# Patient Record
Sex: Female | Born: 1975
Health system: Southern US, Community
[De-identification: ages and names within clinical notes are randomized; demographics above are authoritative.]

## PROBLEM LIST (undated history)

## (undated) DIAGNOSIS — D649 Anemia, unspecified: Secondary | ICD-10-CM

## (undated) DIAGNOSIS — K219 Gastro-esophageal reflux disease without esophagitis: Secondary | ICD-10-CM

## (undated) DIAGNOSIS — D219 Benign neoplasm of connective and other soft tissue, unspecified: Secondary | ICD-10-CM

## (undated) HISTORY — DX: Benign neoplasm of connective and other soft tissue, unspecified: D21.9

---

## 1997-01-19 HISTORY — PX: TUBAL LIGATION: SHX77

## 1997-01-19 HISTORY — PX: ECTOPIC PREGNANCY SURGERY: SHX613

## 1997-07-26 ENCOUNTER — Inpatient Hospital Stay (HOSPITAL_COMMUNITY): Admission: EM | Admit: 1997-07-26 | Discharge: 1997-07-28 | Payer: Self-pay | Admitting: Emergency Medicine

## 1998-05-30 ENCOUNTER — Encounter: Payer: Self-pay | Admitting: Emergency Medicine

## 1998-05-30 ENCOUNTER — Emergency Department (HOSPITAL_COMMUNITY): Admission: EM | Admit: 1998-05-30 | Discharge: 1998-05-30 | Payer: Self-pay | Admitting: Emergency Medicine

## 1998-12-23 ENCOUNTER — Emergency Department (HOSPITAL_COMMUNITY): Admission: EM | Admit: 1998-12-23 | Discharge: 1998-12-23 | Payer: Self-pay | Admitting: Emergency Medicine

## 1999-05-22 ENCOUNTER — Emergency Department (HOSPITAL_COMMUNITY): Admission: EM | Admit: 1999-05-22 | Discharge: 1999-05-22 | Payer: Self-pay | Admitting: Internal Medicine

## 2000-03-23 ENCOUNTER — Ambulatory Visit (HOSPITAL_COMMUNITY): Admission: RE | Admit: 2000-03-23 | Discharge: 2000-03-23 | Payer: Self-pay | Admitting: Obstetrics and Gynecology

## 2000-03-23 ENCOUNTER — Encounter: Payer: Self-pay | Admitting: Obstetrics and Gynecology

## 2002-03-22 ENCOUNTER — Emergency Department (HOSPITAL_COMMUNITY): Admission: EM | Admit: 2002-03-22 | Discharge: 2002-03-22 | Payer: Self-pay | Admitting: Emergency Medicine

## 2002-05-17 ENCOUNTER — Emergency Department (HOSPITAL_COMMUNITY): Admission: EM | Admit: 2002-05-17 | Discharge: 2002-05-17 | Payer: Self-pay | Admitting: Emergency Medicine

## 2002-05-17 ENCOUNTER — Encounter: Payer: Self-pay | Admitting: Emergency Medicine

## 2004-06-16 ENCOUNTER — Emergency Department (HOSPITAL_COMMUNITY): Admission: EM | Admit: 2004-06-16 | Discharge: 2004-06-16 | Payer: Self-pay | Admitting: Emergency Medicine

## 2005-12-22 ENCOUNTER — Emergency Department (HOSPITAL_COMMUNITY): Admission: EM | Admit: 2005-12-22 | Discharge: 2005-12-22 | Payer: Self-pay | Admitting: Emergency Medicine

## 2008-01-02 ENCOUNTER — Emergency Department (HOSPITAL_COMMUNITY): Admission: EM | Admit: 2008-01-02 | Discharge: 2008-01-02 | Payer: Self-pay | Admitting: Emergency Medicine

## 2010-10-23 LAB — RAPID STREP SCREEN (MED CTR MEBANE ONLY): Streptococcus, Group A Screen (Direct): NEGATIVE

## 2014-08-14 ENCOUNTER — Other Ambulatory Visit
Admission: RE | Admit: 2014-08-14 | Discharge: 2014-08-14 | Disposition: A | Discharge status: Home / Self Care | Payer: PRIVATE HEALTH INSURANCE | Source: Ambulatory Visit | Attending: Occupational Medicine | Admitting: Occupational Medicine

## 2015-08-12 ENCOUNTER — Emergency Department (HOSPITAL_COMMUNITY)
Admission: EM | Admit: 2015-08-12 | Discharge: 2015-08-12 | Disposition: A | Discharge status: Home / Self Care | Payer: Self-pay

## 2015-08-12 ENCOUNTER — Emergency Department (HOSPITAL_BASED_OUTPATIENT_CLINIC_OR_DEPARTMENT_OTHER): Payer: 59

## 2015-08-12 ENCOUNTER — Emergency Department (HOSPITAL_BASED_OUTPATIENT_CLINIC_OR_DEPARTMENT_OTHER)
Admission: EM | Admit: 2015-08-12 | Discharge: 2015-08-12 | Disposition: A | Discharge status: Home / Self Care | Payer: 59 | Attending: Emergency Medicine | Admitting: Emergency Medicine

## 2015-08-12 ENCOUNTER — Encounter (HOSPITAL_BASED_OUTPATIENT_CLINIC_OR_DEPARTMENT_OTHER): Payer: Self-pay

## 2015-08-12 DIAGNOSIS — R1013 Epigastric pain: Secondary | ICD-10-CM | POA: Insufficient documentation

## 2015-08-12 DIAGNOSIS — R079 Chest pain, unspecified: Secondary | ICD-10-CM | POA: Diagnosis not present

## 2015-08-12 LAB — CBC WITH DIFFERENTIAL/PLATELET
BASOS ABS: 0 10*3/uL (ref 0.0–0.1)
Basophils Relative: 0 %
EOS PCT: 2 %
Eosinophils Absolute: 0.1 10*3/uL (ref 0.0–0.7)
HCT: 31.8 % — ABNORMAL LOW (ref 36.0–46.0)
HEMOGLOBIN: 10.4 g/dL — AB (ref 12.0–15.0)
LYMPHS PCT: 39 %
Lymphs Abs: 1.8 10*3/uL (ref 0.7–4.0)
MCH: 27.5 pg (ref 26.0–34.0)
MCHC: 32.7 g/dL (ref 30.0–36.0)
MCV: 84.1 fL (ref 78.0–100.0)
Monocytes Absolute: 0.5 10*3/uL (ref 0.1–1.0)
Monocytes Relative: 11 %
NEUTROS PCT: 48 %
Neutro Abs: 2.2 10*3/uL (ref 1.7–7.7)
PLATELETS: 347 10*3/uL (ref 150–400)
RBC: 3.78 MIL/uL — AB (ref 3.87–5.11)
RDW: 12.7 % (ref 11.5–15.5)
WBC: 4.6 10*3/uL (ref 4.0–10.5)

## 2015-08-12 LAB — COMPREHENSIVE METABOLIC PANEL
ALBUMIN: 3.9 g/dL (ref 3.5–5.0)
ALK PHOS: 48 U/L (ref 38–126)
ALT: 20 U/L (ref 14–54)
ANION GAP: 9 (ref 5–15)
AST: 21 U/L (ref 15–41)
BUN: 20 mg/dL (ref 6–20)
CALCIUM: 9.3 mg/dL (ref 8.9–10.3)
CHLORIDE: 103 mmol/L (ref 101–111)
CO2: 23 mmol/L (ref 22–32)
Creatinine, Ser: 1.02 mg/dL — ABNORMAL HIGH (ref 0.44–1.00)
GFR calc non Af Amer: >60 mL/min (ref >60)
GLUCOSE: 100 mg/dL — AB (ref 65–99)
POTASSIUM: 3.5 mmol/L (ref 3.5–5.1)
SODIUM: 135 mmol/L (ref 135–145)
Total Bilirubin: 0.3 mg/dL (ref 0.3–1.2)
Total Protein: 7.4 g/dL (ref 6.5–8.1)

## 2015-08-12 LAB — TROPONIN I

## 2015-08-12 LAB — LIPASE, BLOOD: Lipase: 19 U/L (ref 11–51)

## 2015-08-12 MED ORDER — FAMOTIDINE 20 MG PO TABS
20.0000 mg | ORAL_TABLET | Freq: Once | ORAL | Status: AC
  Administered 2015-08-12: 20 mg via ORAL
  Filled 2015-08-12: qty 1

## 2015-08-12 MED ORDER — OMEPRAZOLE 20 MG PO CPDR: 20.0000 mg | DELAYED_RELEASE_CAPSULE | Freq: Every day | ORAL | 1 refills | Status: DC

## 2015-08-12 MED ORDER — GI COCKTAIL ~~LOC~~
30.0000 mL | Freq: Once | ORAL | Status: AC
Start: 2015-08-12 — End: 2015-08-12
  Administered 2015-08-12: 30 mL via ORAL
  Filled 2015-08-12: qty 30

## 2015-08-12 NOTE — ED Provider Notes (Signed)
[facility] Provider Note   CSN: [id number] Arrival date & time: 08/12/15  2008  First Provider Contact: 08/12/2015 10:11 PM By signing my name below, I, Sara Zuniga, attest that this documentation has been prepared under the direction and in the presence of Charlesetta Shanks, MD. Electronically Signed: Georgette Zuniga, ED Scribe. 08/12/15. 10:16 PM.  History   Chief Complaint Chief Complaint  Patient presents with  . Chest Pain    HPI Comments: Sara Zuniga is a 40 y.o. female who presents to the Emergency Department complaining of sudden onset, intermittent, dull, aching chest pain radiating to her back onset three days ago. Pt has shortness of breath secondary to her chest pain. She has associated constipation. Per pt, the pain wakes her up in the morning. She notes that the pain comes more frequently at night. Eating or drinking a soda makes her belch which improves the pain. Pt has taken Prilosec and Ibuprofen with mild relief. Pt has no known h/o GERD. Pt is not a smoker. Pt denies leg swelling, vomiting, diarrhea, dysuria, cough, and rhinorrhea. Pt is currently asymptomatic at this time.  The history is provided by the patient. No language interpreter was used.    History reviewed. No pertinent past medical history.  There are no active problems to display for this patient.   Past Surgical History:  Procedure Laterality Date  . ECTOPIC PREGNANCY SURGERY      OB History    No data available       Home Medications    Prior to Admission medications   Medication Sig Start Date End Date Taking? Authorizing Provider  omeprazole (PRILOSEC) 20 MG capsule Take 1 capsule (20 mg total) by mouth daily. 08/12/15   Charlesetta Shanks, MD    Family History No family history on file.  Social History Social History  Substance Use Topics  . Smoking status: Never Smoker  . Smokeless tobacco: Never Used  . Alcohol use Yes     Comment: weekends     Allergies   Review of  patient's allergies indicates no known allergies.   Review of Systems Review of Systems A complete 10 system review of systems was obtained and all systems are negative except as noted in the HPI and PMH.   Physical Exam Updated Vital Signs BP 117/82   Pulse 87   Temp 98.6 F (37 C) (Oral)   Resp 15   Ht 5\' 9"  (1.753 m)   Wt 169 lb (76.7 kg)   LMP 07/28/2015 (Exact Date)   SpO2 98%   BMI 24.96 kg/m   Physical Exam  Constitutional: She is oriented to person, place, and time. She appears well-developed and well-nourished.  HENT:  Head: Normocephalic.  Eyes: Conjunctivae are normal.  Cardiovascular: Normal rate, regular rhythm and normal heart sounds.  Exam reveals no gallop and no friction rub.   No murmur heard. Pulmonary/Chest: Effort normal and breath sounds normal. No respiratory distress. She has no wheezes. She has no rales.  Abdominal: Soft. Bowel sounds are normal. She exhibits no distension. There is no tenderness. There is no guarding.  Musculoskeletal: Normal range of motion. She exhibits no edema or tenderness.  Neurological: She is alert and oriented to person, place, and time. She exhibits normal muscle tone. Coordination normal.  Skin: Skin is warm and dry.  Psychiatric: She has a normal mood and affect. Her behavior is normal.  Nursing note and vitals reviewed.    ED Treatments / Results  DIAGNOSTIC STUDIES:  Oxygen Saturation is 100% on RA, normal by my interpretation.    COORDINATION OF CARE: 10:16 PM Discussed treatment plan with pt at bedside which includes lab work and pt agreed to plan.  Labs (all labs ordered are listed, but only abnormal results are displayed) Labs Reviewed  COMPREHENSIVE METABOLIC PANEL - Abnormal; Notable for the following:       Result Value   Glucose, Bld 100 (*)    Creatinine, Ser 1.02 (*)    All other components within normal limits  CBC WITH DIFFERENTIAL/PLATELET - Abnormal; Notable for the following:    RBC 3.78 (*)      Hemoglobin 10.4 (*)    HCT 31.8 (*)    All other components within normal limits  LIPASE, BLOOD  TROPONIN I    EKG  EKG Interpretation None       Radiology Dg Chest 2 View  Result Date: 08/12/2015 CLINICAL DATA:  40 year old female with chest pain EXAM: CHEST  2 VIEW COMPARISON:  Chest radiograph dated 12/22/2005 FINDINGS: The heart size and mediastinal contours are within normal limits. Both lungs are clear. The visualized skeletal structures are unremarkable. IMPRESSION: No active cardiopulmonary disease. Electronically Signed   By: Anner Crete M.D.   On: 08/12/2015 22:49   Procedures Procedures (including critical care time)  Medications Ordered in ED Medications  gi cocktail (Maalox,Lidocaine,Donnatal) (30 mLs Oral Given 08/12/15 2238)  famotidine (PEPCID) tablet 20 mg (20 mg Oral Given 08/12/15 2238)     Initial Impression / Assessment and Plan / ED Course  I have reviewed the triage vital signs and the nursing notes.  Pertinent labs & imaging results that were available during my care of the patient were reviewed by me and considered in my medical decision making (see chart for details).  Clinical Course     Final Clinical Impressions(s) / ED Diagnoses   Final diagnoses:  Epigastric abdominal pain  Patient's symptoms are suggestive of GERD or gastritis. She is experiencing nighttime pain with belching and improvement with sitting in upright position. She is also experiencing daytime symptoms. These wax and wane. At this time she has no pain or tenderness to palpation. Patient to be started on daily PPI for the next 2 weeks. She is counseled to follow-up for response to treatment and to determine if further diagnostic evaluation is indicated such as upper endoscopy. This is been reviewed with the patient.  New Prescriptions New Prescriptions   OMEPRAZOLE (PRILOSEC) 20 MG CAPSULE    Take 1 capsule (20 mg total) by mouth daily.       Charlesetta Shanks,  MD 08/12/15 2352

## 2015-08-12 NOTE — ED Notes (Signed)
Pt went to restroom when she checked in and heard someone say there is a 7 hr wait pt then came to desk and stated she was going somewhere else due to the wait

## 2015-08-12 NOTE — ED Notes (Signed)
Pt states she has been awaken with pain in the mornings and eating or drinking a soda produces belching which makes pain better. Pt states later in the day pain will return. No known hx of GERD.

## 2015-08-12 NOTE — ED Triage Notes (Signed)
Intermittent CP x 3 days-NAD-steady gait

## 2015-08-12 NOTE — ED Notes (Signed)
MD at bedside to evaluate pt at this time.

## 2016-02-25 DIAGNOSIS — Z124 Encounter for screening for malignant neoplasm of cervix: Secondary | ICD-10-CM | POA: Diagnosis not present

## 2016-02-25 DIAGNOSIS — N760 Acute vaginitis: Secondary | ICD-10-CM | POA: Diagnosis not present

## 2016-02-25 DIAGNOSIS — Z1231 Encounter for screening mammogram for malignant neoplasm of breast: Secondary | ICD-10-CM | POA: Diagnosis not present

## 2016-02-25 DIAGNOSIS — Z113 Encounter for screening for infections with a predominantly sexual mode of transmission: Secondary | ICD-10-CM | POA: Diagnosis not present

## 2016-02-25 DIAGNOSIS — Z13220 Encounter for screening for lipoid disorders: Secondary | ICD-10-CM | POA: Diagnosis not present

## 2016-02-25 DIAGNOSIS — Z6826 Body mass index (BMI) 26.0-26.9, adult: Secondary | ICD-10-CM | POA: Diagnosis not present

## 2016-02-25 DIAGNOSIS — Z1329 Encounter for screening for other suspected endocrine disorder: Secondary | ICD-10-CM | POA: Diagnosis not present

## 2016-02-25 DIAGNOSIS — Z01419 Encounter for gynecological examination (general) (routine) without abnormal findings: Secondary | ICD-10-CM | POA: Diagnosis not present

## 2016-02-25 MED FILL — TINIDAZOLE 500 MG TABLET: 500 | 2 days supply | Qty: 8 | Fill #0

## 2016-02-25 MED FILL — HEATHER TABLET: 0.35 | 84 days supply | Qty: 84 | Fill #0

## 2016-02-26 LAB — HEMOGLOBIN A1C: Hemoglobin A1C: 5.2

## 2016-02-26 LAB — TSH: TSH: 1.38 u[IU]/mL (ref 0.41–5.90)

## 2016-02-26 LAB — BASIC METABOLIC PANEL
BUN: 9 mg/dL (ref 4–21)
Creatinine: 0.8 mg/dL (ref 0.5–1.1)
GLUCOSE: 87 mg/dL
Potassium: 4 mmol/L (ref 3.4–5.3)
SODIUM: 137 mmol/L (ref 137–147)

## 2016-02-26 LAB — HEPATIC FUNCTION PANEL
ALT: 10 U/L (ref 7–35)
AST: 15 U/L (ref 13–35)
Bilirubin, Total: 0.3 mg/dL

## 2016-02-26 LAB — VITAMIN D 25 HYDROXY (VIT D DEFICIENCY, FRACTURES): Vit D, 25-Hydroxy: 28

## 2016-02-26 LAB — LIPID PANEL
Cholesterol: 159 mg/dL (ref 0–200)
HDL: 49 mg/dL (ref 35–70)
LDL CALC: 91 mg/dL
TRIGLYCERIDES: 94 mg/dL (ref 40–160)

## 2016-02-27 ENCOUNTER — Ambulatory Visit: Payer: Self-pay | Admitting: Nurse Practitioner

## 2016-03-11 DIAGNOSIS — D259 Leiomyoma of uterus, unspecified: Secondary | ICD-10-CM | POA: Diagnosis not present

## 2016-03-23 ENCOUNTER — Ambulatory Visit (INDEPENDENT_AMBULATORY_CARE_PROVIDER_SITE_OTHER): Payer: 59 | Admitting: Nurse Practitioner

## 2016-03-23 ENCOUNTER — Encounter: Payer: Self-pay | Admitting: Nurse Practitioner

## 2016-03-23 VITALS — BP 126/84 | HR 86 | Temp 98.4°F | Ht 69.0 in | Wt 173.0 lb

## 2016-03-23 DIAGNOSIS — M25562 Pain in left knee: Secondary | ICD-10-CM | POA: Diagnosis not present

## 2016-03-23 DIAGNOSIS — D259 Leiomyoma of uterus, unspecified: Secondary | ICD-10-CM | POA: Diagnosis not present

## 2016-03-23 DIAGNOSIS — F5102 Adjustment insomnia: Secondary | ICD-10-CM

## 2016-03-23 DIAGNOSIS — F4321 Adjustment disorder with depressed mood: Secondary | ICD-10-CM | POA: Insufficient documentation

## 2016-03-23 DIAGNOSIS — F4320 Adjustment disorder, unspecified: Secondary | ICD-10-CM | POA: Diagnosis not present

## 2016-03-23 DIAGNOSIS — N921 Excessive and frequent menstruation with irregular cycle: Secondary | ICD-10-CM | POA: Diagnosis not present

## 2016-03-23 DIAGNOSIS — Z0000 Encounter for general adult medical examination without abnormal findings: Secondary | ICD-10-CM | POA: Diagnosis not present

## 2016-03-23 DIAGNOSIS — Z136 Encounter for screening for cardiovascular disorders: Secondary | ICD-10-CM

## 2016-03-23 DIAGNOSIS — Z13220 Encounter for screening for lipoid disorders: Secondary | ICD-10-CM | POA: Diagnosis not present

## 2016-03-23 DIAGNOSIS — G4700 Insomnia, unspecified: Secondary | ICD-10-CM | POA: Insufficient documentation

## 2016-03-23 MED ORDER — NAPROXEN SODIUM 220 MG PO TABS: 220.0000 mg | ORAL_TABLET | Freq: Two times a day (BID) | ORAL | 0 refills | Status: DC | PRN

## 2016-03-23 NOTE — Assessment & Plan Note (Signed)
Monitored by [facility] at this time. Current use of oral progesterone to regular heavy menstrual cycle.

## 2016-03-23 NOTE — Assessment & Plan Note (Addendum)
Monitored by [facility] at this time. Current use of oral progesterone to regular heavy menstrual cycle Suprapubic bulge, non tender. Patient states this could be uterine fibroid (pending records from GYN).

## 2016-03-23 NOTE — Assessment & Plan Note (Addendum)
Secondary to Grief (deceased fiancee 2016-02-18, unexpected, sepsis complication). Denies use of ETOH to sleep. Consider use of SSRI in future. Avoid benzodiazepine due to possible ETOH use to cope with grief.

## 2016-03-23 NOTE — Progress Notes (Signed)
Subjective:    Patient ID: Sara Zuniga, female    DOB: February 10, 1975, 41 y.o.   MRN: [id number]  Patient presents today for establish care (new patient).  Knee Pain   There was no injury mechanism. The pain is present in the left knee (onset 48month ago). The quality of the pain is described as aching. The pain is mild. The pain has been intermittent since onset. Pertinent negatives include no inability to bear weight, loss of motion, loss of sensation, muscle weakness, numbness or tingling. The symptoms are aggravated by movement. She has tried nothing for the symptoms.  Insomnia  Primary symptoms: fragmented sleep, no sleep disturbance, no difficulty falling asleep, no frequent awakening, premature morning awakening, no malaise/fatigue, no napping.  Episode onset: onset 112/28/17after fiancee's death. The onset quality is sudden. The problem occurs nightly. The problem is unchanged. The symptoms are aggravated by alcohol and anxiety (grief). How many beverages per day that contain caffeine: 0 - 1.  Types of beverages you drink: soda. The symptoms are relieved by medication and relaxation (Sleep aid OTC). Past treatments include alcohol and medication. The treatment provided mild relief. Typical bedtime:  8-10 P.M..  How long after going to bed to you fall asleep: 30-60 minutes.   PMH includes: depression. Prior diagnostic workup includes:  No prior workup.  Depression         This is a new problem.  The current episode started more than 1 month ago.   The onset quality is sudden.   The problem occurs constantly.  The problem has been gradually improving since onset.  Associated symptoms include insomnia, irritable, decreased interest and sad.  Associated symptoms include no decreased concentration, no fatigue, no helplessness, no hopelessness, no restlessness, no appetite change, no body aches, no myalgias, no headaches, no indigestion and no suicidal ideas.     Exacerbated by: death of fiancee  12017-12-28  Past treatments include psychotherapy.  Compliance with treatment is good.  Previous treatment provided mild relief.  Risk factors include alcohol intake and stress.   Grief: Fiancee deceased 112-28-17 Sepsis complication.  Immunizations: (TDAP, Hep C screen, Pneumovax, Influenza, zoster)  Health Maintenance  Topic Date Due  . HIV Screening  11/22/1990  . Tetanus Vaccine  11/22/1994  . Pap Smear  11/21/1996  . Flu Shot  Completed   Diet:regular Weight:  Wt Readings from Last 3 Encounters:  03/23/16 173 lb (78.5 kg)  08/12/15 169 lb (76.7 kg)   Exercise:none Fall Risk: Fall Risk  03/23/2016  Falls in the past year? No   Home Safety:home with sister for emotional support Depression/Suicide: [facility] 03/23/2016  Decreased Interest 0  Down, Depressed, Hopeless 0  PHQ - 2 Score 0   No flowsheet data found. Pap Smear (every 337yrfor >21-29 without HPV, every 5y41yror >30-65y14yrth HPV): last done 02/2016 by ([facility]), normal per patient Mammogram (yearly, >83yr58yrst done 02/2016, normal per patient. Vision:needed, corrective lens Dental:needed, patient will schedule Advanced Directive: Advanced Directives 03/23/2016  Does Patient Have a Medical Advance Directive? No  Would patient like information on creating a medical advance directive? -   Sexual History (birth control, marital status, STD):single, not currently sexually active, use of oral progesterone to regular heavy menstrual cycle  Medications and allergies reviewed with patient and updated if appropriate.  Patient Active Problem List   Diagnosis Date Noted  . Uterine fibroid 03/23/2016  . Grief 03/23/2016  . Insomnia 03/23/2016  .  Menorrhagia with irregular cycle 03/23/2016    Current Outpatient Prescriptions on File Prior to Visit  Medication Sig Dispense Refill  . omeprazole (PRILOSEC) 20 MG capsule Take 1 capsule (20 mg total) by mouth daily. 14 capsule 1   No current  facility-administered medications on file prior to visit.     Past Medical History:  Diagnosis Date  . Fibroids     Past Surgical History:  Procedure Laterality Date  . ECTOPIC PREGNANCY SURGERY      Social History   Social History  . Marital status: Single    Spouse name: N/A  . Number of children: N/A  . Years of education: N/A   Social History Main Topics  . Smoking status: Never Smoker  . Smokeless tobacco: Never Used  . Alcohol use Yes     Comment: weekends  . Drug use: No  . Sexual activity: Not Asked   Other Topics Concern  . None   Social History Narrative  . None    Family History  Problem Relation Age of Onset  . COPD Mother   . Hypertension Sister   . Seizures Sister         Review of Systems  Constitutional: Negative for appetite change, fatigue, fever, malaise/fatigue and weight loss.  HENT: Negative for congestion and sore throat.   Eyes:       Negative for visual changes  Respiratory: Negative for cough and shortness of breath.   Cardiovascular: Negative for chest pain, palpitations and leg swelling.  Gastrointestinal: Negative for blood in stool, constipation, diarrhea and heartburn.  Genitourinary: Negative for dysuria, frequency and urgency.  Musculoskeletal: Negative for falls, joint pain and myalgias.  Skin: Negative for rash.  Neurological: Negative for dizziness, tingling, sensory change, numbness and headaches.  Endo/Heme/Allergies: Does not bruise/bleed easily.  Psychiatric/Behavioral: Positive for depression. Negative for decreased concentration, sleep disturbance, substance abuse and suicidal ideas. The patient is nervous/anxious and has insomnia.     Objective:   Vitals:   03/23/16 1048  BP: 126/84  Pulse: 86  Temp: 98.4 F (36.9 C)    Body mass index is 25.55 kg/m.   Physical Examination:  Physical Exam  Constitutional: She is oriented to person, place, and time and well-developed, well-nourished, and in no  distress. She is irritable. No distress.  HENT:  Right Ear: External ear normal.  Left Ear: External ear normal.  Nose: Nose normal.  Mouth/Throat: Oropharynx is clear and moist. No oropharyngeal exudate.  Eyes: Conjunctivae and EOM are normal. Pupils are equal, round, and reactive to light. No scleral icterus.  Neck: Normal range of motion. Neck supple. No thyromegaly present.  Cardiovascular: Normal rate, normal heart sounds and intact distal pulses.   Pulmonary/Chest: Effort normal and breath sounds normal. She exhibits no tenderness.  Abdominal: Soft. Bowel sounds are normal. She exhibits mass. She exhibits no distension. There is no tenderness.  Suprapubic bulge, non tender. Patient states this could be uterine fibroid (pending records from GYN)  Musculoskeletal: Normal range of motion. She exhibits no edema or tenderness.  Lymphadenopathy:    She has no cervical adenopathy.  Neurological: She is alert and oriented to person, place, and time. Gait normal.  Skin: Skin is warm and dry.  Psychiatric: Affect and judgment normal.    ASSESSMENT and PLAN:  Sara Zuniga was seen today for hypertension, knee pain and establish care.  Diagnoses and all orders for this visit:  Encounter for medical examination to establish care -     CBC  w/Diff; Future -     Comp Met (CMET); Future -     TSH; Future  Grief  Left lateral knee pain -     naproxen sodium (ALEVE) 220 MG tablet; Take 1 tablet (220 mg total) by mouth 2 (two) times daily between meals as needed. For knee pain  Encounter for lipid screening for cardiovascular disease -     Lipid panel; Future  Adjustment insomnia -     CBC w/Diff; Future -     Comp Met (CMET); Future -     TSH; Future  Menorrhagia with irregular cycle -     Ferritin; Future -     IBC panel; Future  Uterine leiomyoma, unspecified location    Uterine fibroid Monitored by [facility] at this time. Current use of oral progesterone to regular  heavy menstrual cycle Suprapubic bulge, non tender. Patient states this could be uterine fibroid (pending records from GYN).  Menorrhagia with irregular cycle Monitored by [facility] at this time. Current use of oral progesterone to regular heavy menstrual cycle.  Insomnia Secondary to Grief (deceased fiancee 01-04-2016, unexpected, sepsis complication). Denies use of ETOH to sleep. Consider use of SSRI in future. Avoid benzodiazepine due to possible ETOH use to cope with grief.  Grief Current psychotherapy sessions. I am concerned about current ETOH consumption (drinks 5shots of vodka every other weekend and 1glass of wine about 2-3times during the week). Advised patient to decrease ETOH consumption to 1 alcoholic drink 7-6HYWVP a week.     Follow up: Return in about 3 months (around 06/23/2016) for grief and insomnia.  Wilfred Lacy, NP

## 2016-03-23 NOTE — Patient Instructions (Signed)

## 2016-03-23 NOTE — Assessment & Plan Note (Signed)
Current psychotherapy sessions. I am concerned about current ETOH consumption (drinks 5shots of vodka every other weekend and 1glass of wine about 2-3times during the week). Advised patient to decrease ETOH consumption to 1 alcoholic drink [id number] a week.

## 2016-03-24 ENCOUNTER — Other Ambulatory Visit (INDEPENDENT_AMBULATORY_CARE_PROVIDER_SITE_OTHER): Payer: 59

## 2016-03-24 DIAGNOSIS — N921 Excessive and frequent menstruation with irregular cycle: Secondary | ICD-10-CM

## 2016-03-24 DIAGNOSIS — Z0000 Encounter for general adult medical examination without abnormal findings: Secondary | ICD-10-CM

## 2016-03-24 DIAGNOSIS — Z136 Encounter for screening for cardiovascular disorders: Secondary | ICD-10-CM

## 2016-03-24 DIAGNOSIS — Z13220 Encounter for screening for lipoid disorders: Secondary | ICD-10-CM | POA: Diagnosis not present

## 2016-03-24 DIAGNOSIS — F5102 Adjustment insomnia: Secondary | ICD-10-CM | POA: Diagnosis not present

## 2016-03-24 LAB — CBC WITH DIFFERENTIAL/PLATELET
BASOS PCT: 0.4 % (ref 0.0–3.0)
Basophils Absolute: 0 10*3/uL (ref 0.0–0.1)
EOS ABS: 0.1 10*3/uL (ref 0.0–0.7)
Eosinophils Relative: 2.7 % (ref 0.0–5.0)
HEMATOCRIT: 32.8 % — AB (ref 36.0–46.0)
Hemoglobin: 10.7 g/dL — ABNORMAL LOW (ref 12.0–15.0)
LYMPHS ABS: 1.3 10*3/uL (ref 0.7–4.0)
LYMPHS PCT: 36 % (ref 12.0–46.0)
MCHC: 32.5 g/dL (ref 30.0–36.0)
MCV: 81.9 fl (ref 78.0–100.0)
Monocytes Absolute: 0.6 10*3/uL (ref 0.1–1.0)
Monocytes Relative: 16.9 % — ABNORMAL HIGH (ref 3.0–12.0)
NEUTROS ABS: 1.5 10*3/uL (ref 1.4–7.7)
Neutrophils Relative %: 44 % (ref 43.0–77.0)
PLATELETS: 401 10*3/uL — AB (ref 150.0–400.0)
RBC: 4 Mil/uL (ref 3.87–5.11)
RDW: 13.6 % (ref 11.5–15.5)
WBC: 3.5 10*3/uL — ABNORMAL LOW (ref 4.0–10.5)

## 2016-03-24 LAB — COMPREHENSIVE METABOLIC PANEL
ALT: 9 U/L (ref 0–35)
AST: 13 U/L (ref 0–37)
Albumin: 3.8 g/dL (ref 3.5–5.2)
Alkaline Phosphatase: 52 U/L (ref 39–117)
BUN: 11 mg/dL (ref 6–23)
CALCIUM: 9.2 mg/dL (ref 8.4–10.5)
CHLORIDE: 108 meq/L (ref 96–112)
CO2: 26 meq/L (ref 19–32)
CREATININE: 0.89 mg/dL (ref 0.40–1.20)
GFR: 90.19 mL/min (ref >60.00)
Glucose, Bld: 103 mg/dL — ABNORMAL HIGH (ref 70–99)
Potassium: 4 mEq/L (ref 3.5–5.1)
Sodium: 139 mEq/L (ref 135–145)
Total Bilirubin: 0.3 mg/dL (ref 0.2–1.2)
Total Protein: 7.1 g/dL (ref 6.0–8.3)

## 2016-03-24 LAB — LIPID PANEL
CHOLESTEROL: 141 mg/dL (ref 0–200)
HDL: 42 mg/dL (ref >39.00)
LDL CALC: 85 mg/dL (ref 0–99)
NonHDL: 98.73
TRIGLYCERIDES: 67 mg/dL (ref 0.0–149.0)
Total CHOL/HDL Ratio: 3
VLDL: 13.4 mg/dL (ref 0.0–40.0)

## 2016-03-24 LAB — IBC PANEL
Iron: 33 ug/dL — ABNORMAL LOW (ref 42–145)
Saturation Ratios: 7.3 % — ABNORMAL LOW (ref 20.0–50.0)
Transferrin: 324 mg/dL (ref 212.0–360.0)

## 2016-03-24 LAB — FERRITIN: Ferritin: 5.6 ng/mL — ABNORMAL LOW (ref 10.0–291.0)

## 2016-03-24 LAB — TSH: TSH: 2.69 u[IU]/mL (ref 0.35–4.50)

## 2016-03-26 ENCOUNTER — Telehealth: Payer: Self-pay | Admitting: Nurse Practitioner

## 2016-03-26 NOTE — Telephone Encounter (Signed)
Patient called back.  Was given [facility] response on labs.

## 2016-04-02 ENCOUNTER — Telehealth: Payer: Self-pay | Admitting: Nurse Practitioner

## 2016-04-02 NOTE — Telephone Encounter (Signed)
Rec'd from [facility] PA forward 82 pages to Flossie Buffy NP

## 2016-04-20 ENCOUNTER — Encounter: Payer: Self-pay | Admitting: Nurse Practitioner

## 2016-04-20 NOTE — Progress Notes (Signed)
Abstracted result and sent to scan  

## 2016-06-25 ENCOUNTER — Ambulatory Visit: Payer: Self-pay | Admitting: Nurse Practitioner

## 2016-07-09 ENCOUNTER — Encounter: Payer: Self-pay | Admitting: Nurse Practitioner

## 2016-07-09 ENCOUNTER — Ambulatory Visit (INDEPENDENT_AMBULATORY_CARE_PROVIDER_SITE_OTHER): Payer: 59 | Admitting: Nurse Practitioner

## 2016-07-09 VITALS — BP 108/76 | HR 77 | Temp 98.7°F | Ht 69.0 in | Wt 174.0 lb

## 2016-07-09 DIAGNOSIS — F4320 Adjustment disorder, unspecified: Secondary | ICD-10-CM

## 2016-07-09 DIAGNOSIS — D500 Iron deficiency anemia secondary to blood loss (chronic): Secondary | ICD-10-CM

## 2016-07-09 DIAGNOSIS — F4321 Adjustment disorder with depressed mood: Secondary | ICD-10-CM

## 2016-07-09 DIAGNOSIS — F5102 Adjustment insomnia: Secondary | ICD-10-CM | POA: Diagnosis not present

## 2016-07-09 MED ORDER — FERROUS SULFATE 325 (65 FE) MG PO TABS: 325.0000 mg | ORAL_TABLET | Freq: Two times a day (BID) | ORAL | 3 refills | Status: DC

## 2016-07-09 NOTE — Patient Instructions (Signed)
Iron-Rich Diet Iron is a mineral that helps your body to produce hemoglobin. Hemoglobin is a protein in your red blood cells that carries oxygen to your body's tissues. Eating too little iron may cause you to feel weak and tired, and it can increase your risk for infection. Eating enough iron is necessary for your body's metabolism, muscle function, and nervous system. Iron is naturally found in many foods. It can also be added to foods or fortified in foods. There are two types of dietary iron:  Heme iron. Heme iron is absorbed by the body more easily than nonheme iron. Heme iron is found in meat, poultry, and fish.  Nonheme iron. Nonheme iron is found in dietary supplements, iron-fortified grains, beans, and vegetables.  You may need to follow an iron-rich diet if:  You have been diagnosed with iron deficiency or iron-deficiency anemia.  You have a condition that prevents you from absorbing dietary iron, such as: ? Infection in your intestines. ? Celiac disease. This involves long-lasting (chronic) inflammation of your intestines.  You do not eat enough iron.  You eat a diet that is high in foods that impair iron absorption.  You have lost a lot of blood.  You have heavy bleeding during your menstrual cycle.  You are pregnant.  What is my plan? Your health care provider may help you to determine how much iron you need per day based on your condition. Generally, when a person consumes sufficient amounts of iron in the diet, the following iron needs are met:  Men. ? 14-18 years old: 11 mg per day. ? 19-50 years old: 8 mg per day.  Women. ? 14-18 years old: 15 mg per day. ? 19-50 years old: 18 mg per day. ? Over 50 years old: 8 mg per day. ? Pregnant women: 27 mg per day. ? Breastfeeding women: 9 mg per day.  What do I need to know about an iron-rich diet?  Eat fresh fruits and vegetables that are high in vitamin C along with foods that are high in iron. This will help  increase the amount of iron that your body absorbs from food, especially with foods containing nonheme iron. Foods that are high in vitamin C include oranges, peppers, tomatoes, and mango.  Take iron supplements only as directed by your health care provider. Overdose of iron can be life-threatening. If you were prescribed iron supplements, take them with orange juice or a vitamin C supplement.  Cook foods in pots and pans that are made from iron.  Eat nonheme iron-containing foods alongside foods that are high in heme iron. This helps to improve your iron absorption.  Certain foods and drinks contain compounds that impair iron absorption. Avoid eating these foods in the same meal as iron-rich foods or with iron supplements. These include: ? Coffee, black tea, and red wine. ? Milk, dairy products, and foods that are high in calcium. ? Beans, soybeans, and peas. ? Whole grains.  When eating foods that contain both nonheme iron and compounds that impair iron absorption, follow these tips to absorb iron better. ? Soak beans overnight before cooking. ? Soak whole grains overnight and drain them before using. ? Ferment flours before baking, such as using yeast in bread dough. What foods can I eat? Grains Iron-fortified breakfast cereal. Iron-fortified whole-wheat bread. Enriched rice. Sprouted grains. Vegetables Spinach. Potatoes with skin. Green peas. Broccoli. Red and green bell peppers. Fermented vegetables. Fruits Prunes. Raisins. Oranges. Strawberries. Mango. Grapefruit. Meats and Other Protein Sources   Beef liver. Oysters. Beef. Shrimp. [place]. Chicken. [place]. Sardines. Chickpeas. Nuts. Tofu. Beverages Tomato juice. Fresh orange juice. Prune juice. Hibiscus tea. Fortified instant breakfast shakes. Condiments Tahini. Fermented soy sauce. Sweets and Desserts Black-strap molasses. Other Wheat germ. The items listed above may not be a complete list of recommended foods or beverages.  Contact your dietitian for more options. What foods are not recommended? Grains Whole grains. Bran cereal. Bran flour. Oats. Vegetables Artichokes. Brussels sprouts. Kale. Fruits Blueberries. Raspberries. Strawberries. Figs. Meats and Other Protein Sources Soybeans. Products made from soy protein. Dairy Milk. Cream. Cheese. Yogurt. Cottage cheese. Beverages Coffee. Black tea. Red wine. Sweets and Desserts Cocoa. Chocolate. Ice cream. Other Basil. Oregano. Parsley. The items listed above may not be a complete list of foods and beverages to avoid. Contact your dietitian for more information. This information is not intended to replace advice given to you by your health care provider. Make sure you discuss any questions you have with your health care provider. Document Released: 08/19/2004 Document Revised: 07/26/2015 Document Reviewed: 08/02/2013 Elsevier Interactive Patient Education  [employer].

## 2016-07-09 NOTE — Progress Notes (Signed)
Subjective:  Patient ID: Sara Zuniga, female    DOB: 01/18/1976  Age: 41 y.o. MRN: [id number]  CC: Follow-up (3 mo fu--ankles swelling later part of the day)   HPI  Ankle edema: Worse with prolong sitting at end of work day. No injury. No claudication. No recent travel or prolonged immobility.  Insomnia: Improved. Deceased ETOH use to once a week with social events.  Grief: Significant decrease in periods of sadness. Stopped counseling 65months ago due to improved mood.  Anemia: Does not take ferrous sulfate as recommended. States she forget to take medication.  Outpatient Medications Prior to Visit  Medication Sig Dispense Refill  . naproxen sodium (ALEVE) 220 MG tablet Take 1 tablet (220 mg total) by mouth 2 (two) times daily between meals as needed. For knee pain 30 tablet 0  . omeprazole (PRILOSEC) 20 MG capsule Take 1 capsule (20 mg total) by mouth daily. 14 capsule 1  . HEATHER 0.35 MG tablet   4   No facility-administered medications prior to visit.     ROS Review of Systems  Constitutional: Negative.   Respiratory: Negative.   Cardiovascular: Positive for leg swelling. Negative for chest pain and palpitations.  Gastrointestinal: Negative.   Neurological: Negative.   Psychiatric/Behavioral: Negative for depression, substance abuse and suicidal ideas. The patient is not nervous/anxious and does not have insomnia.    Objective:  BP 108/76   Pulse 77   Temp 98.7 F (37.1 C)   Ht 5\' 9"  (1.753 m)   Wt 174 lb (78.9 kg)   SpO2 99%   BMI 25.70 kg/m   BP Readings from Last 3 Encounters:  07/09/16 108/76  03/23/16 126/84  08/12/15 106/67    Wt Readings from Last 3 Encounters:  07/09/16 174 lb (78.9 kg)  03/23/16 173 lb (78.5 kg)  08/12/15 169 lb (76.7 kg)    Physical Exam  Constitutional: She is oriented to person, place, and time. No distress.  Neck: Normal range of motion. Neck supple. No thyromegaly present.  Cardiovascular: Normal rate,  regular rhythm and normal heart sounds.   Pulmonary/Chest: Effort normal and breath sounds normal.  Musculoskeletal: She exhibits no edema or tenderness.  Lymphadenopathy:    She has no cervical adenopathy.  Neurological: She is alert and oriented to person, place, and time.  Vitals reviewed.   Lab Results  Component Value Date   WBC 3.5 (L) 03/24/2016   HGB 10.7 (L) 03/24/2016   HCT 32.8 (L) 03/24/2016   PLT 401.0 (H) 03/24/2016   GLUCOSE 103 (H) 03/24/2016   CHOL 141 03/24/2016   TRIG 67.0 03/24/2016   HDL 42.00 03/24/2016   LDLCALC 85 03/24/2016   ALT 9 03/24/2016   AST 13 03/24/2016   NA 139 03/24/2016   K 4.0 03/24/2016   CL 108 03/24/2016   CREATININE 0.89 03/24/2016   BUN 11 03/24/2016   CO2 26 03/24/2016   TSH 2.69 03/24/2016   HGBA1C 5.2 02/26/2016    Dg Chest 2 View  Result Date: 08/12/2015 CLINICAL DATA:  41 year old female with chest pain EXAM: CHEST  2 VIEW COMPARISON:  Chest radiograph dated 12/22/2005 FINDINGS: The heart size and mediastinal contours are within normal limits. Both lungs are clear. The visualized skeletal structures are unremarkable. IMPRESSION: No active cardiopulmonary disease. Electronically Signed   By: Anner Crete M.D.   On: 08/12/2015 22:49   Assessment & Plan:   Sara Zuniga was seen today for follow-up.  Diagnoses and all orders for this visit:  Adjustment insomnia  Grief  Iron deficiency anemia due to chronic blood loss -     ferrous sulfate 325 (65 FE) MG tablet; Take 1 tablet (325 mg total) by mouth 2 (two) times daily with a meal.   I am having Sara Zuniga start on ferrous sulfate. I am also having her maintain her omeprazole, HEATHER, and naproxen sodium.  Meds ordered this encounter  Medications  . ferrous sulfate 325 (65 FE) MG tablet    Sig: Take 1 tablet (325 mg total) by mouth 2 (two) times daily with a meal.    Dispense:  60 tablet    Refill:  3    Order Specific Question:   Supervising Provider    Answer:    Binnie Rail [[id number]]    Follow-up: Return in about 1 year (around 07/09/2017) for CPE (fasting).  Sara Lacy, NP

## 2016-09-23 DIAGNOSIS — H52223 Regular astigmatism, bilateral: Secondary | ICD-10-CM | POA: Diagnosis not present

## 2016-09-23 DIAGNOSIS — H5213 Myopia, bilateral: Secondary | ICD-10-CM | POA: Diagnosis not present

## 2016-11-19 ENCOUNTER — Telehealth: Payer: 59 | Admitting: Physician Assistant

## 2016-11-19 DIAGNOSIS — R197 Diarrhea, unspecified: Secondary | ICD-10-CM

## 2016-11-19 NOTE — Progress Notes (Signed)
We are sorry that you are not feeling well.  Here is how we plan to help!  Based on what you have shared with me it looks like you may have either an Acute Infectious Diarrhea (likely viral) or could potentially be related to recent medication change. Stop sleep aid to see if this is contributing. Also follow recommendations below.  Most cases of acute diarrhea are due to infections with virus and bacteria and are self-limited conditions lasting less than 14 days.  For your symptoms you may take Imodium 2 mg tablets that are over the counter at your local pharmacy. Take two tablet now and then one after each loose stool up to 6 a day.  Antibiotics are not needed for most people with diarrhea.   Optional: I have sent in Cipro 500 mg two tablets twice a day for five days or azithromycin 500 mg daily for 3 days.  HOME CARE  We recommend changing your diet to help with your symptoms for the next few days.  Drink plenty of fluids that contain water salt and sugar. Sports drinks such as Gatorade may help.   You may try broths, soups, bananas, applesauce, soft breads, mashed potatoes or crackers.   You are considered infectious for as long as the diarrhea continues. Hand washing or use of alcohol based hand sanitizers is recommend.  It is best to stay out of work or school until your symptoms stop.   GET HELP RIGHT AWAY  If you have dark yellow colored urine or do not pass urine frequently you should drink more fluids.    If your symptoms worsen   If you feel like you are going to pass out (faint)  You have a new problem  MAKE SURE YOU   Understand these instructions.  Will watch your condition.  Will get help right away if you are not doing well or get worse.  Your e-visit answers were reviewed by a board certified advanced clinical practitioner to complete your personal care plan.  Depending on the condition, your plan could have included both over the counter or prescription  medications.  If there is a problem please reply  once you have received a response from your provider.  Your safety is important to [place].  If you have drug allergies check your prescription carefully.    You can use MyChart to ask questions about today's visit, request a non-urgent call back, or ask for a work or school excuse for 24 hours related to this e-Visit. If it has been greater than 24 hours you will need to follow up with your provider, or enter a new e-Visit to address those concerns.   You will get an e-mail in the next two days asking about your experience.  I hope that your e-visit has been valuable and will speed your recovery. Thank you for using e-visits.

## 2017-07-19 IMAGING — DX DG CHEST 2V
2 series · 2 of 2 positions shown · non-contrast
Comparison: Chest radiograph dated 12/22/2005

CLINICAL DATA: 39-year-old female with chest pain

EXAM:
CHEST  2 VIEW

[chest pa]
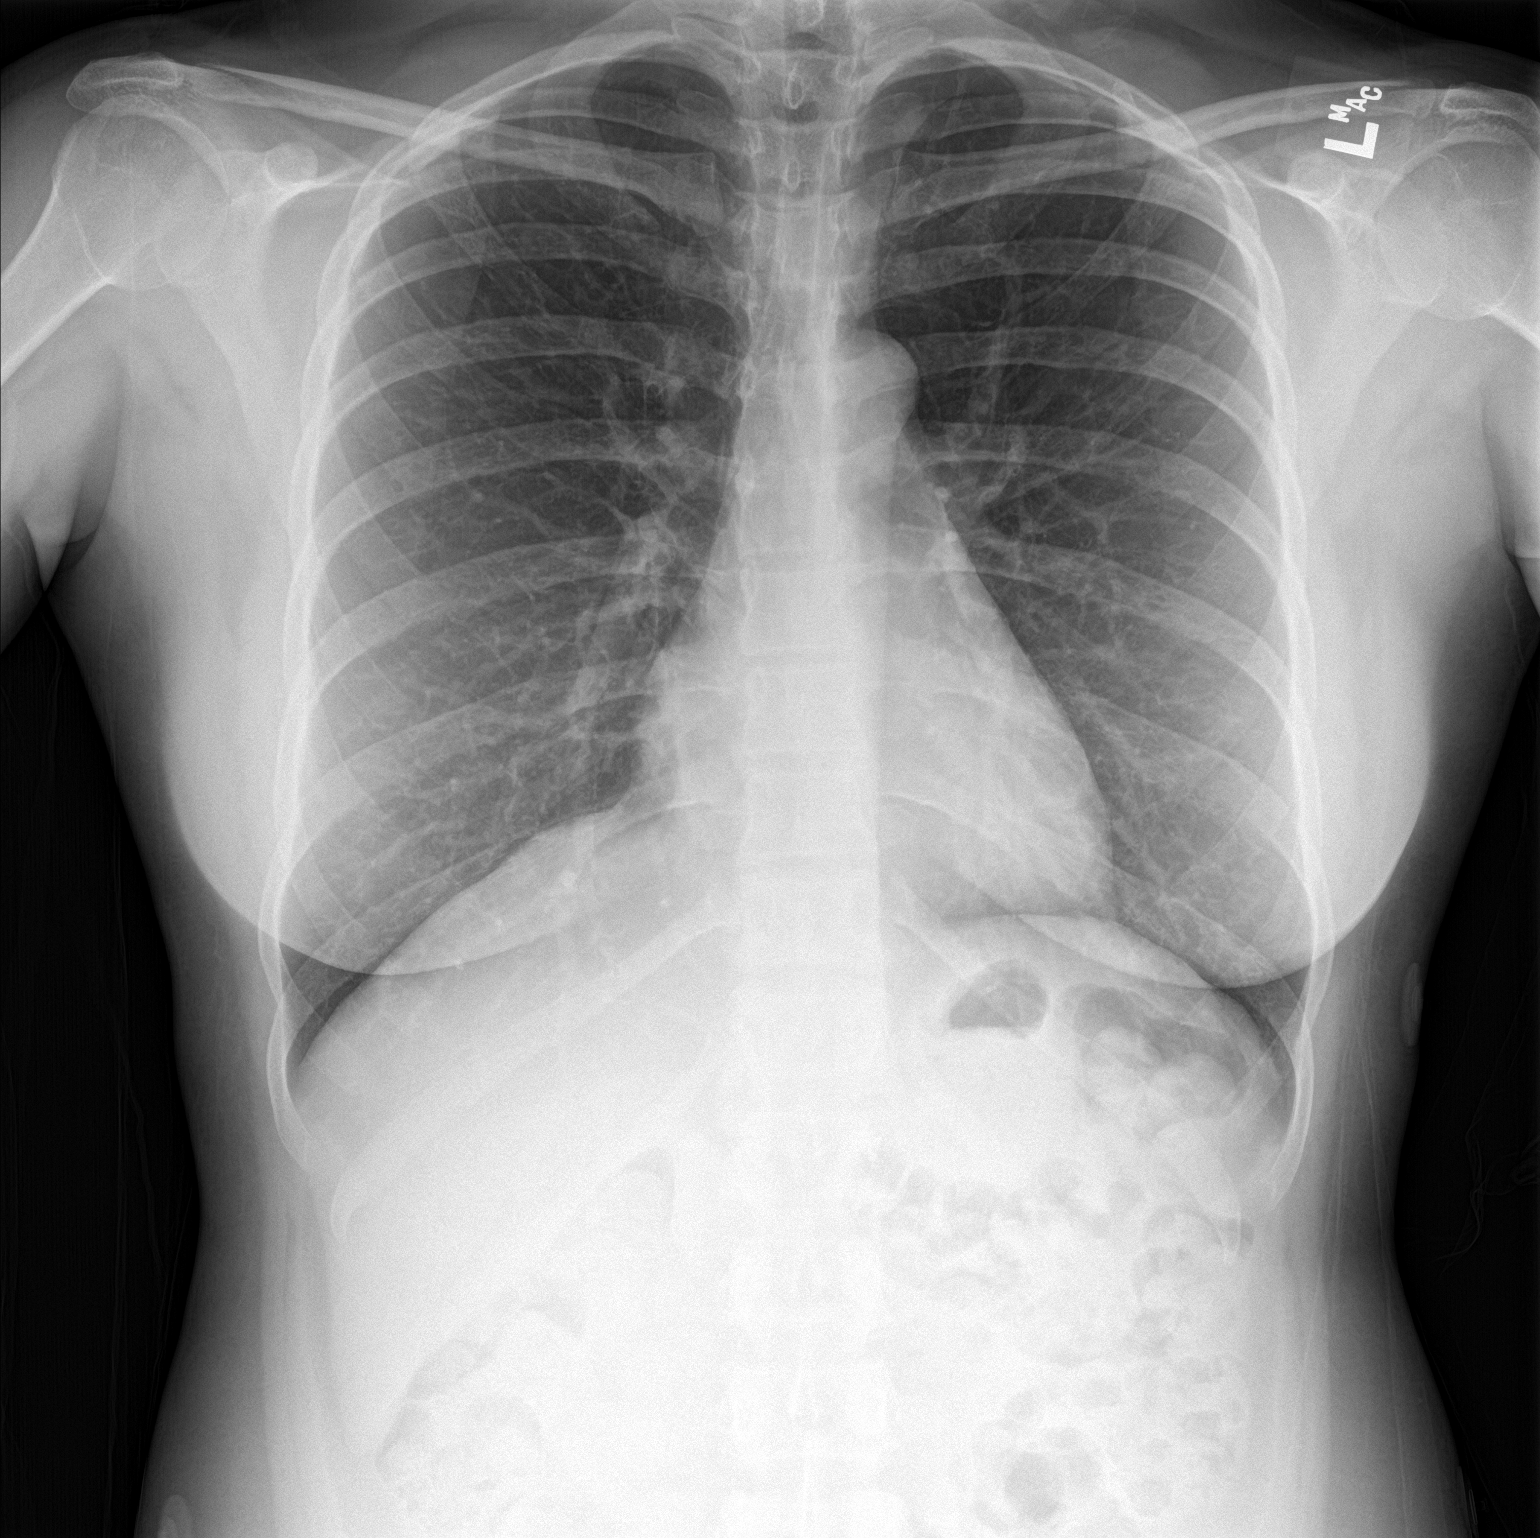

[chest lat]
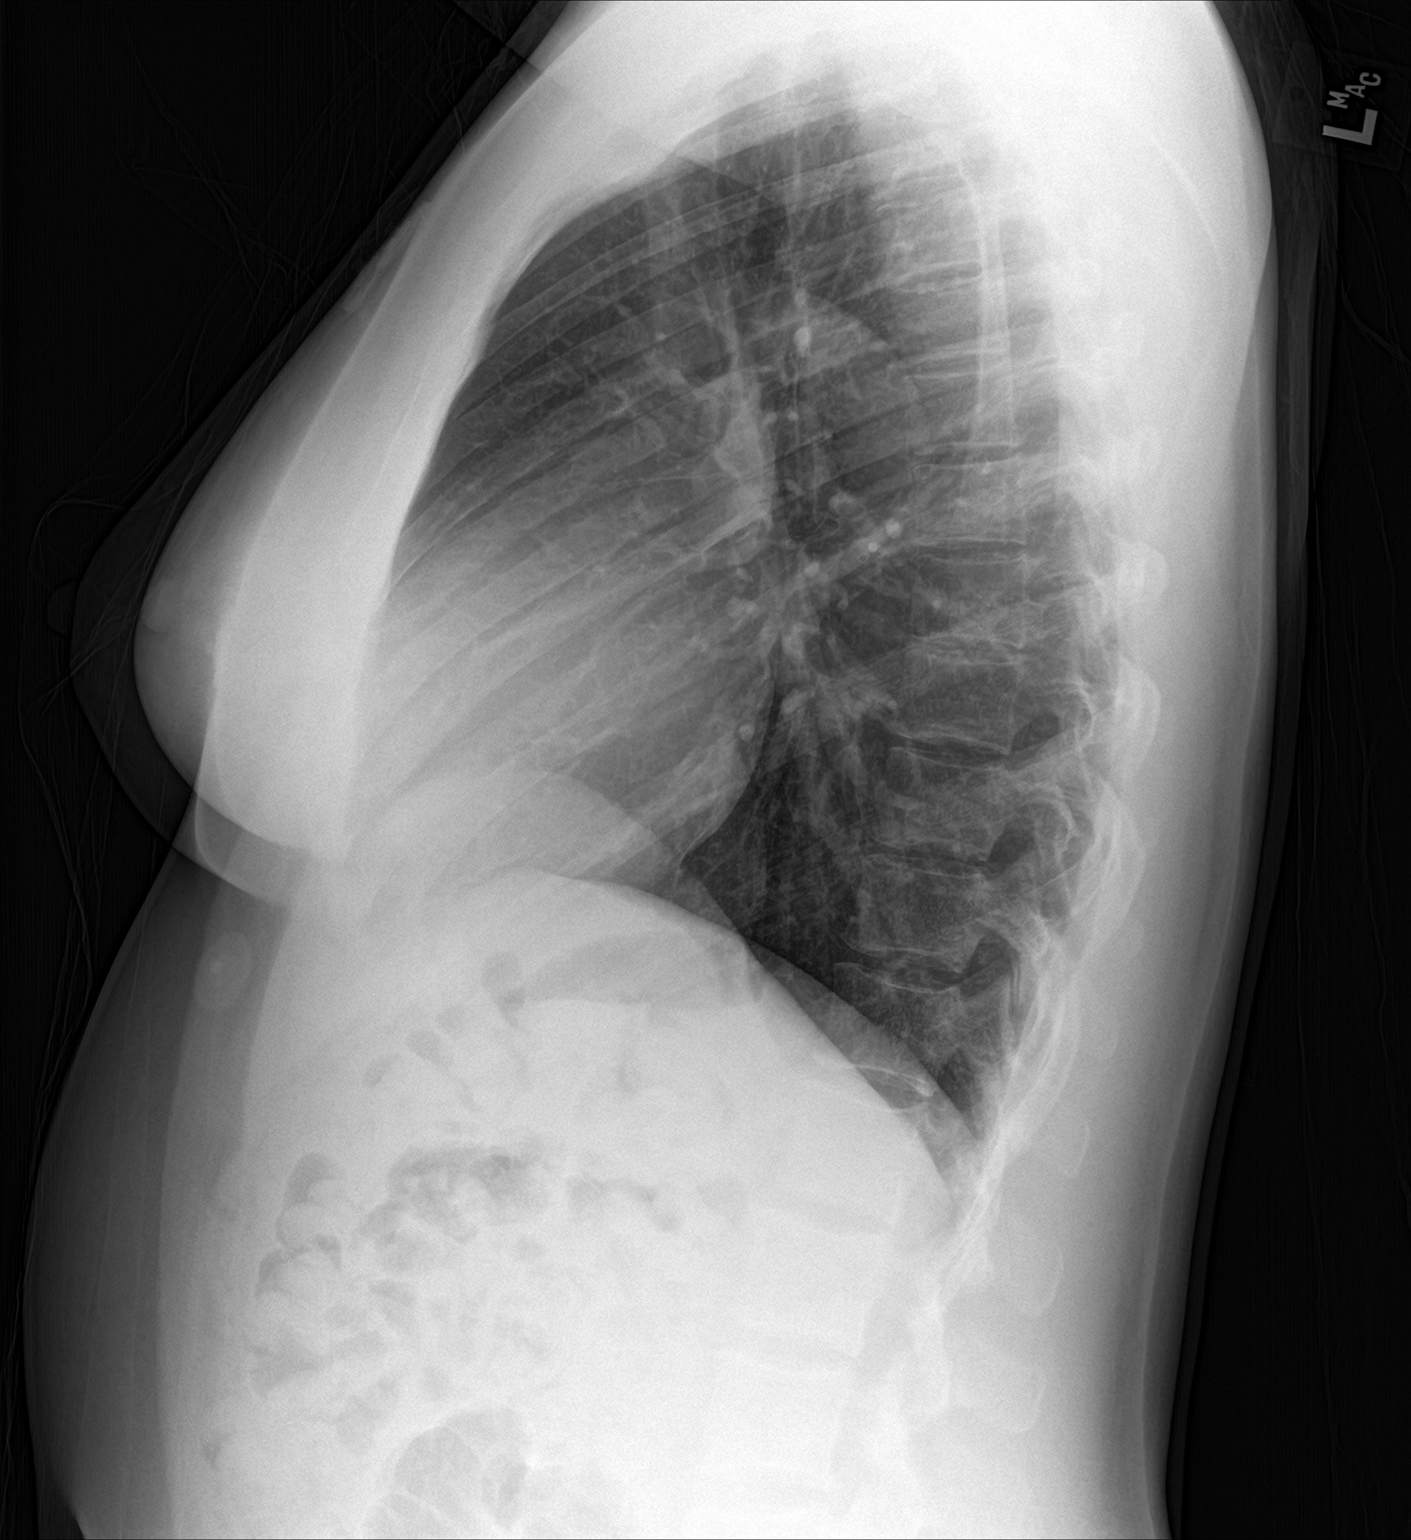

[2 of 2 positions shown; findings below may reference images not displayed]

FINDINGS: The heart size and mediastinal contours are within normal limits.
Both lungs are clear. The visualized skeletal structures are
unremarkable.
IMPRESSION: No active cardiopulmonary disease.

## 2017-08-13 DIAGNOSIS — Z1329 Encounter for screening for other suspected endocrine disorder: Secondary | ICD-10-CM | POA: Diagnosis not present

## 2017-08-13 DIAGNOSIS — Z1231 Encounter for screening mammogram for malignant neoplasm of breast: Secondary | ICD-10-CM | POA: Diagnosis not present

## 2017-08-13 DIAGNOSIS — Z124 Encounter for screening for malignant neoplasm of cervix: Secondary | ICD-10-CM | POA: Diagnosis not present

## 2017-08-13 DIAGNOSIS — Z01419 Encounter for gynecological examination (general) (routine) without abnormal findings: Secondary | ICD-10-CM | POA: Diagnosis not present

## 2017-08-13 DIAGNOSIS — Z130 Encounter for screening for diseases of the blood and blood-forming organs and certain disorders involving the immune mechanism: Secondary | ICD-10-CM | POA: Diagnosis not present

## 2017-08-13 DIAGNOSIS — N899 Noninflammatory disorder of vagina, unspecified: Secondary | ICD-10-CM | POA: Diagnosis not present

## 2017-08-13 DIAGNOSIS — Z13220 Encounter for screening for lipoid disorders: Secondary | ICD-10-CM | POA: Diagnosis not present

## 2017-08-20 ENCOUNTER — Other Ambulatory Visit: Payer: Self-pay | Admitting: Obstetrics and Gynecology

## 2017-08-20 DIAGNOSIS — D259 Leiomyoma of uterus, unspecified: Secondary | ICD-10-CM

## 2017-08-27 ENCOUNTER — Other Ambulatory Visit: Payer: Self-pay

## 2017-09-10 ENCOUNTER — Ambulatory Visit
Admission: RE | Admit: 2017-09-10 | Discharge: 2017-09-10 | Disposition: A | Discharge status: Home / Self Care | Payer: 59 | Source: Ambulatory Visit | Attending: Obstetrics and Gynecology | Admitting: Obstetrics and Gynecology

## 2017-09-10 DIAGNOSIS — D259 Leiomyoma of uterus, unspecified: Secondary | ICD-10-CM | POA: Diagnosis not present

## 2017-09-10 MED ORDER — GADOBENATE DIMEGLUMINE 529 MG/ML IV SOLN
15.0000 mL | Freq: Once | INTRAVENOUS | Status: AC | PRN
  Administered 2017-09-10: 15 mL via INTRAVENOUS

## 2017-09-24 DIAGNOSIS — R946 Abnormal results of thyroid function studies: Secondary | ICD-10-CM | POA: Diagnosis not present

## 2017-10-14 DIAGNOSIS — D251 Intramural leiomyoma of uterus: Secondary | ICD-10-CM | POA: Diagnosis not present

## 2017-10-27 ENCOUNTER — Encounter (HOSPITAL_COMMUNITY): Payer: Self-pay

## 2017-10-27 NOTE — Patient Instructions (Addendum)
Your procedure is scheduled on: Wednesday, Oct. 23, 2019   Surgery Time:  7:30AM-12:30PM   Report to [facility]  Entrance    Report to admitting at 5:30 AM   Call this number if you have problems the morning of surgery [phone]   Do not eat food or drink liquids :After Midnight.   Brush your teeth the morning of surgery.   Do NOT smoke after Midnight   Take these medicines the morning of surgery with A SIP OF WATER: None                               You may not have any metal on your body including hair pins, jewelry, and body piercings             Do not wear make-up, lotions, powders, perfumes/cologne, or deodorant             Do not wear nail polish.  Do not shave  48 hours prior to surgery.             Do not bring valuables to the hospital. [place].   Contacts, dentures or bridgework may not be worn into surgery.   Leave suitcase in the car. After surgery it may be brought to your room.    Special Instructions: Bring a copy of your healthcare power of attorney and living will documents         the day of surgery if you haven't scanned them in before.              Please read over the following fact sheets you were given:  [facility] - Preparing for Surgery Before surgery, you can play an important role.  Because skin is not sterile, your skin needs to be as free of germs as possible.  You can reduce the number of germs on your skin by washing with CHG (chlorahexidine gluconate) soap before surgery.  CHG is an antiseptic cleaner which kills germs and bonds with the skin to continue killing germs even after washing. Please DO NOT use if you have an allergy to CHG or antibacterial soaps.  If your skin becomes reddened/irritated stop using the CHG and inform your nurse when you arrive at Short Stay. Do not shave (including legs and underarms) for at least 48 hours prior to the first CHG shower.  You may  shave your face/neck.  Please follow these instructions carefully:  1.  Shower with CHG Soap the night before surgery and the  morning of surgery.  2.  If you choose to wash your hair, wash your hair first as usual with your normal  shampoo.  3.  After you shampoo, rinse your hair and body thoroughly to remove the shampoo.                             4.  Use CHG as you would any other liquid soap.  You can apply chg directly to the skin and wash.  Gently with a scrungie or clean washcloth.  5.  Apply the CHG Soap to your body ONLY FROM THE NECK DOWN.   Do   not use on face/ open  Wound or open sores. Avoid contact with eyes, ears mouth and   genitals (private parts).                       Wash face,  Genitals (private parts) with your normal soap.             6.  Wash thoroughly, paying special attention to the area where your    surgery  will be performed.  7.  Thoroughly rinse your body with warm water from the neck down.  8.  DO NOT shower/wash with your normal soap after using and rinsing off the CHG Soap.                9.  Pat yourself dry with a clean towel.            10.  Wear clean pajamas.            11.  Place clean sheets on your bed the night of your first shower and do not  sleep with pets. Day of Surgery : Do not apply any lotions/deodorants the morning of surgery.  Please wear clean clothes to the hospital/surgery center.  FAILURE TO FOLLOW THESE INSTRUCTIONS MAY RESULT IN THE CANCELLATION OF YOUR SURGERY  PATIENT SIGNATURE_________________________________  NURSE SIGNATURE__________________________________  ________________________________________________________________________  WHAT IS A BLOOD TRANSFUSION? Blood Transfusion Information  A transfusion is the replacement of blood or some of its parts. Blood is made up of multiple cells which provide different functions.  Red blood cells carry oxygen and are used for blood loss  replacement.  White blood cells fight against infection.  Platelets control bleeding.  Plasma helps clot blood.  Other blood products are available for specialized needs, such as hemophilia or other clotting disorders. BEFORE THE TRANSFUSION  Who gives blood for transfusions?   Healthy volunteers who are fully evaluated to make sure their blood is safe. This is blood bank blood. Transfusion therapy is the safest it has ever been in the practice of medicine. Before blood is taken from a donor, a complete history is taken to make sure that person has no history of diseases nor engages in risky social behavior (examples are intravenous drug use or sexual activity with multiple partners). The donor's travel history is screened to minimize risk of transmitting infections, such as malaria. The donated blood is tested for signs of infectious diseases, such as HIV and hepatitis. The blood is then tested to be sure it is compatible with you in order to minimize the chance of a transfusion reaction. If you or a relative donates blood, this is often done in anticipation of surgery and is not appropriate for emergency situations. It takes many days to process the donated blood. RISKS AND COMPLICATIONS Although transfusion therapy is very safe and saves many lives, the main dangers of transfusion include:   Getting an infectious disease.  Developing a transfusion reaction. This is an allergic reaction to something in the blood you were given. Every precaution is taken to prevent this. The decision to have a blood transfusion has been considered carefully by your caregiver before blood is given. Blood is not given unless the benefits outweigh the risks. AFTER THE TRANSFUSION  Right after receiving a blood transfusion, you will usually feel much better and more energetic. This is especially true if your red blood cells have gotten low (anemic). The transfusion raises the level of the red blood cells which  carry oxygen, and this usually  causes an energy increase.  The nurse administering the transfusion will monitor you carefully for complications. HOME CARE INSTRUCTIONS  No special instructions are needed after a transfusion. You may find your energy is better. Speak with your caregiver about any limitations on activity for underlying diseases you may have. SEEK MEDICAL CARE IF:   Your condition is not improving after your transfusion.  You develop redness or irritation at the intravenous (IV) site. SEEK IMMEDIATE MEDICAL CARE IF:  Any of the following symptoms occur over the next 12 hours:  Shaking chills.  You have a temperature by mouth above 102 F (38.9 C), not controlled by medicine.  Chest, back, or muscle pain.  People around you feel you are not acting correctly or are confused.  Shortness of breath or difficulty breathing.  Dizziness and fainting.  You get a rash or develop hives.  You have a decrease in urine output.  Your urine turns a dark color or changes to pink, red, or brown. Any of the following symptoms occur over the next 10 days:  You have a temperature by mouth above 102 F (38.9 C), not controlled by medicine.  Shortness of breath.  Weakness after normal activity.  The white part of the eye turns yellow (jaundice).  You have a decrease in the amount of urine or are urinating less often.  Your urine turns a dark color or changes to pink, red, or brown. Document Released: 01/03/2000 Document Revised: 03/30/2011 Document Reviewed: 08/22/2007 [facility] Patient Information 2014 [place], [place].  _______________________________________________________________________

## 2017-11-01 ENCOUNTER — Encounter (HOSPITAL_COMMUNITY)
Admission: RE | Admit: 2017-11-01 | Discharge: 2017-11-01 | Disposition: A | Discharge status: Home / Self Care | Payer: 59 | Source: Ambulatory Visit | Attending: Obstetrics and Gynecology | Admitting: Obstetrics and Gynecology

## 2017-11-01 ENCOUNTER — Encounter (HOSPITAL_COMMUNITY): Payer: Self-pay

## 2017-11-01 ENCOUNTER — Other Ambulatory Visit: Payer: Self-pay

## 2017-11-01 DIAGNOSIS — Z01818 Encounter for other preprocedural examination: Secondary | ICD-10-CM | POA: Insufficient documentation

## 2017-11-01 DIAGNOSIS — D259 Leiomyoma of uterus, unspecified: Secondary | ICD-10-CM | POA: Insufficient documentation

## 2017-11-01 HISTORY — DX: Gastro-esophageal reflux disease without esophagitis: K21.9

## 2017-11-01 HISTORY — DX: Anemia, unspecified: D64.9

## 2017-11-01 LAB — CBC
HEMATOCRIT: 33 % — AB (ref 36.0–46.0)
HEMOGLOBIN: 10 g/dL — AB (ref 12.0–15.0)
MCH: 25.8 pg — ABNORMAL LOW (ref 26.0–34.0)
MCHC: 30.3 g/dL (ref 30.0–36.0)
MCV: 85.1 fL (ref 80.0–100.0)
NRBC: 0 % (ref 0.0–0.2)
Platelets: 339 10*3/uL (ref 150–400)
RBC: 3.88 MIL/uL (ref 3.87–5.11)
RDW: 16.9 % — ABNORMAL HIGH (ref 11.5–15.5)
WBC: 3.2 10*3/uL — AB (ref 4.0–10.5)

## 2017-11-02 NOTE — Pre-Procedure Instructions (Signed)
CBC results 11/01/2017 faxed to Dr. Philis Pique via epic.

## 2017-11-05 ENCOUNTER — Other Ambulatory Visit: Payer: Self-pay | Admitting: Obstetrics and Gynecology

## 2017-11-05 NOTE — H&P (Signed)
42 y.o. G0 complains of symptomatic fibroid uterus causing GI and bladder issues, pain and menorrhagia.  US shows:    TA/TVUS: mid position uterus with multiple uterine fibroids (at least 8 measured), more present.  1) 6 x 5.5 x 6.4  2) 4.4 x 2.8 x 3.3  3) 8 x 7 x 7.1  4) 4.9 x 4.3 x 3.6  5) 5.4 x 5. x 6  6) 4.3 x 4.5 x 3.3  7) 6.2 x 7 x 6  8) 3.2 x 2.8 x 2.7  [place] measures more fibroids today than previous scan. Difficult to determine if this represents interval change of fibroids      MRI, pelvis, w/wo contrast  Uterus 21x10x14; multiple fibroids, biggest 13 cm and is in LUS on right displacing cervix and rectum to left; no stigmata of malignancy. Sub mucosal fibroid. Normal adnexa and no other masses. Small amount of ff. No hydronephrosis       Past Medical History:  Diagnosis Date  . Anemia   . Fibroids   . GERD (gastroesophageal reflux disease)    occ   PSH-  ectopic removal via [place]  Social History   Socioeconomic History  . Marital status: Single    Spouse name: Not on file  . Number of children: Not on file  . Years of education: Not on file  . Highest education level: Not on file  Occupational History  . Not on file  Social Needs  . Financial resource strain: Not on file  . Food insecurity:    Worry: Not on file    Inability: Not on file  . Transportation needs:    Medical: Not on file    Non-medical: Not on file  Tobacco Use  . Smoking status: Never Smoker  . Smokeless tobacco: Never Used  Substance and Sexual Activity  . Alcohol use: Yes    Comment: weekends  . Drug use: No  . Sexual activity: Not on file  Lifestyle  . Physical activity:    Days per week: Not on file    Minutes per session: Not on file  . Stress: Not on file  Relationships  . Social connections:    Talks on phone: Not on file    Gets together: Not on file    Attends religious service: Not on file    Active member of club or organization: Not on file    Attends meetings  of clubs or organizations: Not on file    Relationship status: Not on file  . Intimate partner violence:    Fear of current or ex partner: Not on file    Emotionally abused: Not on file    Physically abused: Not on file    Forced sexual activity: Not on file  Other Topics Concern  . Not on file  Social History Narrative  . Not on file    No current facility-administered medications on file prior to encounter.    Current Outpatient Medications on File Prior to Encounter  Medication Sig Dispense Refill  . ferrous sulfate 325 (65 FE) MG tablet Take 1 tablet (325 mg total) by mouth 2 (two) times daily with a meal. (Patient taking differently: Take 325 mg by mouth daily with breakfast. ) 60 tablet 3    No Known Allergies  There were no vitals filed for this visit.   Recent Results (from the past 2160 hour(s))  CBC     Status: Abnormal   Collection Time: 11/01/17  3:47 PM  Result Value Ref Range   WBC 3.2 (L) 4.0 - 10.5 K/uL   RBC 3.88 3.87 - 5.11 MIL/uL   Hemoglobin 10.0 (L) 12.0 - 15.0 g/dL   HCT 33.0 (L) 36.0 - 46.0 %   MCV 85.1 80.0 - 100.0 fL   MCH 25.8 (L) 26.0 - 34.0 pg   MCHC 30.3 30.0 - 36.0 g/dL   RDW 16.9 (H) 11.5 - 15.5 %   Platelets 339 150 - 400 K/uL   nRBC 0.0 0.0 - 0.2 %    Comment: Performed at [facility], [place] [address] [place], [place] [address]    Lungs: clear to ascultation Cor:  RRR Abdomen:  soft, nontender, nondistended. Ex:  no cords, erythema Pelvic:  Vulva: no erythema, no lesions, no vesicles, no masses, no tenderness, no atrophy  Vagina: no erythema, no vesicles, no cystocele, no rectocele, no enterocele, no tenderness, no atrophy, vaginal discharge  Cervix: grossly normal, no discharge, no cervical motion tenderness, sample taken for a Pap smear  Uterus: no uterine prolapse, non-tender, enlarged 18 wks, contour irregular, fibroids, retroverted  Bladder/Urethra: normal meatus, no urethral mass, no tenderness   Adnexa/Parametria no mass palpable, no tenderness    A:  For Robotic laparoscopic total hysterectomy with salpingectomies, cyst, possible BSO, possible open procedure.  Previously:"Examined pt, reviewd MRI results and had long discussion with pt.  Pt has come to realization that pregnancy is unlikely at this point in her life as she would likely need fertility help and has a history of an ectopic.  We discussed the risks and benefits of myomectomy vs hysterectomy; although surgical risks may be closer together if we do open laparatomy, the risks of myomectomy vs hysterectomy are much wider in amount for minimally invasive surgery- these include time of operation, blood loss, pain postop and recovery period. In addition to that, her chances of pregnancy at this point are much smaller than the chances that the fibroids will return before menopause.  We discussed that her ovaries should remain and that her hormones should be normal for many years after hysterectomy although there is a possibility of somewhat earlier menopause after hysterectomy. We discussed removing her tubes to reduce her chances of ovarian cancer in the future if we did do a hysterectomy. We also discussed that with either operation, there is still a significant chance that we will have to do open in order to complete operation if we are unable to get adequate visualization for laparoscopy."    P: P: All risks, benefits and alternatives d/w patient and she desires to proceed.  Patient has undergone a modified diet, ERAS protocol and will receive preop antibiotics and SCDs during the operation.   Pt to have extended recovery but will go home same day if eating, ambulating, voiding and pain control is good.  Kenderick Kobler A

## 2017-11-10 ENCOUNTER — Ambulatory Visit (HOSPITAL_COMMUNITY): Payer: 59 | Admitting: Certified Registered"

## 2017-11-10 ENCOUNTER — Other Ambulatory Visit: Payer: Self-pay

## 2017-11-10 ENCOUNTER — Encounter (HOSPITAL_COMMUNITY): Payer: Self-pay | Admitting: *Deleted

## 2017-11-10 ENCOUNTER — Ambulatory Visit (HOSPITAL_COMMUNITY)
Admission: RE | Admit: 2017-11-10 | Discharge: 2017-11-12 | Disposition: A | Discharge status: Home / Self Care | Payer: 59 | Source: Ambulatory Visit | Attending: Obstetrics and Gynecology | Admitting: Obstetrics and Gynecology

## 2017-11-10 ENCOUNTER — Encounter (HOSPITAL_COMMUNITY)
Admission: RE | Disposition: A | Discharge status: Home / Self Care | Payer: Self-pay | Source: Ambulatory Visit | Attending: Obstetrics and Gynecology

## 2017-11-10 DIAGNOSIS — D649 Anemia, unspecified: Secondary | ICD-10-CM | POA: Insufficient documentation

## 2017-11-10 DIAGNOSIS — N802 Endometriosis of fallopian tube: Secondary | ICD-10-CM | POA: Insufficient documentation

## 2017-11-10 DIAGNOSIS — R000 Tachycardia, unspecified: Secondary | ICD-10-CM | POA: Insufficient documentation

## 2017-11-10 DIAGNOSIS — D259 Leiomyoma of uterus, unspecified: Secondary | ICD-10-CM | POA: Insufficient documentation

## 2017-11-10 DIAGNOSIS — Z9889 Other specified postprocedural states: Secondary | ICD-10-CM

## 2017-11-10 HISTORY — PX: ROBOTIC ASSISTED LAPAROSCOPIC HYSTERECTOMY AND SALPINGECTOMY: SHX6379

## 2017-11-10 LAB — PREGNANCY, URINE: Preg Test, Ur: NEGATIVE

## 2017-11-10 LAB — CBC
HEMATOCRIT: 34.4 % — AB (ref 36.0–46.0)
HEMOGLOBIN: 10.6 g/dL — AB (ref 12.0–15.0)
MCH: 26.2 pg (ref 26.0–34.0)
MCHC: 30.8 g/dL (ref 30.0–36.0)
MCV: 84.9 fL (ref 80.0–100.0)
NRBC: 0 % (ref 0.0–0.2)
Platelets: 321 10*3/uL (ref 150–400)
RBC: 4.05 MIL/uL (ref 3.87–5.11)
RDW: 15.9 % — ABNORMAL HIGH (ref 11.5–15.5)
WBC: 2.1 10*3/uL — ABNORMAL LOW (ref 4.0–10.5)

## 2017-11-10 LAB — ABO/RH: ABO/RH(D): A POS

## 2017-11-10 SURGERY — XI ROBOTIC ASSISTED LAPAROSCOPIC HYSTERECTOMY AND SALPINGECTOMY
Anesthesia: General | Site: Abdomen

## 2017-11-10 MED ORDER — SODIUM CHLORIDE 0.9 % IJ SOLN
INTRAMUSCULAR | Status: AC
  Filled 2017-11-10: qty 100

## 2017-11-10 MED ORDER — FENTANYL CITRATE (PF) 100 MCG/2ML IJ SOLN
INTRAMUSCULAR | Status: DC | PRN
  Administered 2017-11-10 (×7): 50 ug via INTRAVENOUS

## 2017-11-10 MED ORDER — ROCURONIUM BROMIDE 100 MG/10ML IV SOLN
INTRAVENOUS | Status: DC | PRN
  Administered 2017-11-10 (×3): 10 mg via INTRAVENOUS
  Administered 2017-11-10: 20 mg via INTRAVENOUS
  Administered 2017-11-10: 70 mg via INTRAVENOUS
  Administered 2017-11-10 (×2): 20 mg via INTRAVENOUS
  Administered 2017-11-10: 10 mg via INTRAVENOUS
  Administered 2017-11-10: 20 mg via INTRAVENOUS

## 2017-11-10 MED ORDER — OXYCODONE-ACETAMINOPHEN 5-325 MG PO TABS
2.0000 | ORAL_TABLET | ORAL | Status: DC | PRN
  Administered 2017-11-11: 2 via ORAL
  Administered 2017-11-11: 1 via ORAL
  Administered 2017-11-11 – 2017-11-12 (×4): 2 via ORAL
  Filled 2017-11-10 (×5): qty 2

## 2017-11-10 MED ORDER — ESMOLOL HCL 100 MG/10ML IV SOLN
INTRAVENOUS | Status: AC
  Filled 2017-11-10: qty 10

## 2017-11-10 MED ORDER — KETOROLAC TROMETHAMINE 30 MG/ML IJ SOLN
INTRAMUSCULAR | Status: AC
  Filled 2017-11-10: qty 1

## 2017-11-10 MED ORDER — CEFAZOLIN SODIUM-DEXTROSE 2-4 GM/100ML-% IV SOLN
2.0000 g | INTRAVENOUS | Status: AC
  Administered 2017-11-10 (×2): 2 g via INTRAVENOUS
  Filled 2017-11-10: qty 100

## 2017-11-10 MED ORDER — ONDANSETRON HCL 4 MG/2ML IJ SOLN
INTRAMUSCULAR | Status: AC
  Filled 2017-11-10: qty 2

## 2017-11-10 MED ORDER — FENTANYL CITRATE (PF) 250 MCG/5ML IJ SOLN
INTRAMUSCULAR | Status: AC
  Filled 2017-11-10: qty 5

## 2017-11-10 MED ORDER — ONDANSETRON HCL 4 MG PO TABS
4.0000 mg | ORAL_TABLET | Freq: Four times a day (QID) | ORAL | Status: DC | PRN
  Filled 2017-11-10: qty 1

## 2017-11-10 MED ORDER — ALBUMIN HUMAN 5 % IV SOLN
INTRAVENOUS | Status: DC | PRN
Start: 2017-11-10 — End: 2017-11-10
  Administered 2017-11-10 (×2): via INTRAVENOUS

## 2017-11-10 MED ORDER — PANTOPRAZOLE SODIUM 40 MG PO TBEC
DELAYED_RELEASE_TABLET | ORAL | Status: AC
  Filled 2017-11-10: qty 1

## 2017-11-10 MED ORDER — DEXAMETHASONE SODIUM PHOSPHATE 4 MG/ML IJ SOLN: 4.0000 mg | INTRAMUSCULAR | Status: DC

## 2017-11-10 MED ORDER — ARTIFICIAL TEARS OPHTHALMIC OINT
TOPICAL_OINTMENT | OPHTHALMIC | Status: AC
  Filled 2017-11-10: qty 3.5

## 2017-11-10 MED ORDER — DEXTROSE IN LACTATED RINGERS 5 % IV SOLN
INTRAVENOUS | Status: DC
  Administered 2017-11-10 – 2017-11-11 (×2): via INTRAVENOUS

## 2017-11-10 MED ORDER — PANTOPRAZOLE SODIUM 40 MG PO TBEC
40.0000 mg | DELAYED_RELEASE_TABLET | Freq: Every day | ORAL | Status: DC
  Administered 2017-11-10 – 2017-11-12 (×3): 40 mg via ORAL
  Filled 2017-11-10 (×4): qty 1

## 2017-11-10 MED ORDER — PROPOFOL 10 MG/ML IV BOLUS
INTRAVENOUS | Status: DC | PRN
  Administered 2017-11-10: 100 mg via INTRAVENOUS

## 2017-11-10 MED ORDER — HYDROMORPHONE HCL 1 MG/ML IJ SOLN: 0.2500 mg | INTRAMUSCULAR | Status: DC | PRN

## 2017-11-10 MED ORDER — SODIUM CHLORIDE 0.9 % IV SOLN
INTRAVENOUS | Status: DC | PRN
  Administered 2017-11-10: 20 ug/min via INTRAVENOUS

## 2017-11-10 MED ORDER — ROCURONIUM BROMIDE 10 MG/ML (PF) SYRINGE
PREFILLED_SYRINGE | INTRAVENOUS | Status: AC
  Filled 2017-11-10: qty 10

## 2017-11-10 MED ORDER — VASOPRESSIN 20 UNIT/ML IV SOLN
INTRAVENOUS | Status: AC
  Filled 2017-11-10: qty 1

## 2017-11-10 MED ORDER — KETOROLAC TROMETHAMINE 30 MG/ML IJ SOLN
INTRAMUSCULAR | Status: DC | PRN
  Administered 2017-11-10: 30 mg via INTRAVENOUS

## 2017-11-10 MED ORDER — SOD CITRATE-CITRIC ACID 500-334 MG/5ML PO SOLN
30.0000 mL | ORAL | Status: AC
  Administered 2017-11-10: 30 mL via ORAL
  Filled 2017-11-10: qty 30

## 2017-11-10 MED ORDER — HYDROMORPHONE HCL 2 MG/ML IJ SOLN
INTRAMUSCULAR | Status: AC
  Filled 2017-11-10: qty 1

## 2017-11-10 MED ORDER — PROMETHAZINE HCL 25 MG/ML IJ SOLN: 6.2500 mg | INTRAMUSCULAR | Status: DC | PRN

## 2017-11-10 MED ORDER — PHENYLEPHRINE 40 MCG/ML (10ML) SYRINGE FOR IV PUSH (FOR BLOOD PRESSURE SUPPORT)
PREFILLED_SYRINGE | INTRAVENOUS | Status: AC
  Filled 2017-11-10: qty 10

## 2017-11-10 MED ORDER — BUTORPHANOL TARTRATE 1 MG/ML IJ SOLN: 1.0000 mg | INTRAMUSCULAR | Status: DC | PRN

## 2017-11-10 MED ORDER — MIDAZOLAM HCL 5 MG/5ML IJ SOLN
INTRAMUSCULAR | Status: DC | PRN
  Administered 2017-11-10: 2 mg via INTRAVENOUS

## 2017-11-10 MED ORDER — ONDANSETRON HCL 4 MG/2ML IJ SOLN
INTRAMUSCULAR | Status: DC | PRN
  Administered 2017-11-10 (×2): 4 mg via INTRAVENOUS

## 2017-11-10 MED ORDER — DEXAMETHASONE SODIUM PHOSPHATE 10 MG/ML IJ SOLN
INTRAMUSCULAR | Status: DC | PRN
  Administered 2017-11-10: 10 mg via INTRAVENOUS

## 2017-11-10 MED ORDER — PROPOFOL 10 MG/ML IV BOLUS
INTRAVENOUS | Status: AC
  Filled 2017-11-10: qty 20

## 2017-11-10 MED ORDER — ALBUMIN HUMAN 5 % IV SOLN
INTRAVENOUS | Status: AC
  Filled 2017-11-10: qty 250

## 2017-11-10 MED ORDER — CELECOXIB 200 MG PO CAPS
400.0000 mg | ORAL_CAPSULE | ORAL | Status: AC
  Administered 2017-11-10: 400 mg via ORAL
  Filled 2017-11-10: qty 2

## 2017-11-10 MED ORDER — ZOLPIDEM TARTRATE 5 MG PO TABS: 5.0000 mg | ORAL_TABLET | Freq: Every evening | ORAL | Status: DC | PRN

## 2017-11-10 MED ORDER — PHENYLEPHRINE HCL 10 MG/ML IJ SOLN
INTRAMUSCULAR | Status: AC
  Filled 2017-11-10: qty 1

## 2017-11-10 MED ORDER — FERROUS SULFATE 325 (65 FE) MG PO TABS
325.0000 mg | ORAL_TABLET | Freq: Every day | ORAL | Status: DC
  Administered 2017-11-11 – 2017-11-12 (×2): 325 mg via ORAL
  Filled 2017-11-10 (×3): qty 1

## 2017-11-10 MED ORDER — LIDOCAINE 2% (20 MG/ML) 5 ML SYRINGE
INTRAMUSCULAR | Status: AC
  Filled 2017-11-10: qty 5

## 2017-11-10 MED ORDER — CEFAZOLIN SODIUM-DEXTROSE 2-4 GM/100ML-% IV SOLN
INTRAVENOUS | Status: AC
  Filled 2017-11-10: qty 100

## 2017-11-10 MED ORDER — ESMOLOL HCL 100 MG/10ML IV SOLN
INTRAVENOUS | Status: DC | PRN
  Administered 2017-11-10 (×6): 10 mg via INTRAVENOUS

## 2017-11-10 MED ORDER — SUGAMMADEX SODIUM 200 MG/2ML IV SOLN
INTRAVENOUS | Status: DC | PRN
  Administered 2017-11-10: 150 mg via INTRAVENOUS

## 2017-11-10 MED ORDER — SODIUM CHLORIDE 0.9 % IR SOLN
Status: DC | PRN
  Administered 2017-11-10 (×2): 3000 mL

## 2017-11-10 MED ORDER — FENTANYL CITRATE (PF) 100 MCG/2ML IJ SOLN
INTRAMUSCULAR | Status: AC
  Filled 2017-11-10: qty 2

## 2017-11-10 MED ORDER — IBUPROFEN 800 MG PO TABS
800.0000 mg | ORAL_TABLET | Freq: Three times a day (TID) | ORAL | Status: DC | PRN
  Administered 2017-11-11: 800 mg via ORAL
  Filled 2017-11-10: qty 1

## 2017-11-10 MED ORDER — SIMETHICONE 80 MG PO CHEW
80.0000 mg | CHEWABLE_TABLET | Freq: Four times a day (QID) | ORAL | Status: DC | PRN
  Filled 2017-11-10: qty 1

## 2017-11-10 MED ORDER — INDIGOTINDISULFONATE SODIUM 8 MG/ML IJ SOLN
INTRAMUSCULAR | Status: DC | PRN
  Administered 2017-11-10: 5 mL via INTRAVENOUS

## 2017-11-10 MED ORDER — PHENYLEPHRINE 40 MCG/ML (10ML) SYRINGE FOR IV PUSH (FOR BLOOD PRESSURE SUPPORT)
PREFILLED_SYRINGE | INTRAVENOUS | Status: AC
  Filled 2017-11-10: qty 20

## 2017-11-10 MED ORDER — OXYCODONE HCL 5 MG/5ML PO SOLN
5.0000 mg | Freq: Once | ORAL | Status: DC | PRN
  Filled 2017-11-10: qty 5

## 2017-11-10 MED ORDER — LACTATED RINGERS IV SOLN
INTRAVENOUS | Status: DC | PRN
  Administered 2017-11-10: 08:00:00 via INTRAVENOUS

## 2017-11-10 MED ORDER — ROPIVACAINE HCL 5 MG/ML IJ SOLN
INTRAMUSCULAR | Status: AC
  Filled 2017-11-10: qty 30

## 2017-11-10 MED ORDER — SODIUM CHLORIDE 0.9 % IJ SOLN
INTRAMUSCULAR | Status: AC
  Filled 2017-11-10: qty 50

## 2017-11-10 MED ORDER — SCOPOLAMINE 1 MG/3DAYS TD PT72
1.0000 | MEDICATED_PATCH | TRANSDERMAL | Status: DC
  Administered 2017-11-10: 1.5 mg via TRANSDERMAL
  Filled 2017-11-10: qty 1

## 2017-11-10 MED ORDER — MENTHOL 3 MG MT LOZG
1.0000 | LOZENGE | OROMUCOSAL | Status: DC | PRN
  Filled 2017-11-10: qty 9

## 2017-11-10 MED ORDER — SENNOSIDES-DOCUSATE SODIUM 8.6-50 MG PO TABS
1.0000 | ORAL_TABLET | Freq: Every evening | ORAL | Status: DC | PRN
  Filled 2017-11-10: qty 1

## 2017-11-10 MED ORDER — LIDOCAINE 2% (20 MG/ML) 5 ML SYRINGE
INTRAMUSCULAR | Status: DC | PRN
  Administered 2017-11-10: 60 mg via INTRAVENOUS

## 2017-11-10 MED ORDER — DEXAMETHASONE SODIUM PHOSPHATE 10 MG/ML IJ SOLN
INTRAMUSCULAR | Status: AC
  Filled 2017-11-10: qty 1

## 2017-11-10 MED ORDER — HYDROMORPHONE HCL 1 MG/ML IJ SOLN
INTRAMUSCULAR | Status: DC | PRN
  Administered 2017-11-10 (×2): 0.5 mg via INTRAVENOUS

## 2017-11-10 MED ORDER — GABAPENTIN 300 MG PO CAPS
300.0000 mg | ORAL_CAPSULE | ORAL | Status: AC
  Administered 2017-11-10: 300 mg via ORAL
  Filled 2017-11-10: qty 1

## 2017-11-10 MED ORDER — ALBUMIN HUMAN 5 % IV SOLN
INTRAVENOUS | Status: AC
  Filled 2017-11-10: qty 500

## 2017-11-10 MED ORDER — OXYCODONE HCL 5 MG PO TABS: 5.0000 mg | ORAL_TABLET | Freq: Once | ORAL | Status: DC | PRN

## 2017-11-10 MED ORDER — ACETAMINOPHEN 500 MG PO TABS
1000.0000 mg | ORAL_TABLET | ORAL | Status: AC
  Administered 2017-11-10: 1000 mg via ORAL
  Filled 2017-11-10: qty 2

## 2017-11-10 MED ORDER — SUGAMMADEX SODIUM 200 MG/2ML IV SOLN
INTRAVENOUS | Status: AC
  Filled 2017-11-10: qty 2

## 2017-11-10 MED ORDER — KETOROLAC TROMETHAMINE 30 MG/ML IJ SOLN
30.0000 mg | Freq: Four times a day (QID) | INTRAMUSCULAR | Status: DC
  Administered 2017-11-10 – 2017-11-11 (×2): 30 mg via INTRAVENOUS

## 2017-11-10 MED ORDER — PHENYLEPHRINE 40 MCG/ML (10ML) SYRINGE FOR IV PUSH (FOR BLOOD PRESSURE SUPPORT)
PREFILLED_SYRINGE | INTRAVENOUS | Status: DC | PRN
  Administered 2017-11-10: 40 ug via INTRAVENOUS
  Administered 2017-11-10: 120 ug via INTRAVENOUS
  Administered 2017-11-10: 80 ug via INTRAVENOUS
  Administered 2017-11-10: 40 ug via INTRAVENOUS
  Administered 2017-11-10: 80 ug via INTRAVENOUS
  Administered 2017-11-10: 40 ug via INTRAVENOUS
  Administered 2017-11-10: 120 ug via INTRAVENOUS
  Administered 2017-11-10: 80 ug via INTRAVENOUS
  Administered 2017-11-10 (×5): 40 ug via INTRAVENOUS
  Administered 2017-11-10: 80 ug via INTRAVENOUS

## 2017-11-10 MED ORDER — LACTATED RINGERS IV SOLN
INTRAVENOUS | Status: DC
  Administered 2017-11-10 (×3): via INTRAVENOUS

## 2017-11-10 MED ORDER — MIDAZOLAM HCL 2 MG/2ML IJ SOLN
INTRAMUSCULAR | Status: AC
  Filled 2017-11-10: qty 2

## 2017-11-10 MED ORDER — VASOPRESSIN 20 UNIT/ML IV SOLN
INTRAVENOUS | Status: DC | PRN
  Administered 2017-11-10: 25 mL via INTRAMUSCULAR

## 2017-11-10 MED ORDER — ONDANSETRON HCL 4 MG/2ML IJ SOLN
4.0000 mg | Freq: Four times a day (QID) | INTRAMUSCULAR | Status: DC | PRN
  Administered 2017-11-10 – 2017-11-11 (×2): 4 mg via INTRAVENOUS
  Filled 2017-11-10: qty 2

## 2017-11-10 SURGICAL SUPPLY — 59 items
ADH SKN CLS APL DERMABOND .7 (GAUZE/BANDAGES/DRESSINGS) ×1
BARRIER ADHS 3X4 INTERCEED (GAUZE/BANDAGES/DRESSINGS) IMPLANT
BRR ADH 4X3 ABS CNTRL BYND (GAUZE/BANDAGES/DRESSINGS)
CANISTER SUCT 3000ML PPV (MISCELLANEOUS) ×2 IMPLANT
CATH FOLEY 2WAY SLVR  5CC 16FR (CATHETERS) ×1
CATH FOLEY 2WAY SLVR 5CC 16FR (CATHETERS) ×1 IMPLANT
COVER BACK TABLE 60X90IN (DRAPES) ×2 IMPLANT
COVER TIP SHEARS 8 DVNC (MISCELLANEOUS) ×1 IMPLANT
COVER TIP SHEARS 8MM DA VINCI (MISCELLANEOUS) ×2
DECANTER SPIKE VIAL GLASS SM (MISCELLANEOUS) ×2 IMPLANT
DEFOGGER SCOPE WARMER CLEARIFY (MISCELLANEOUS) ×2 IMPLANT
DERMABOND ADVANCED (GAUZE/BANDAGES/DRESSINGS) ×1
DERMABOND ADVANCED .7 DNX12 (GAUZE/BANDAGES/DRESSINGS) ×1 IMPLANT
DRAPE ARM DVNC X/XI (DISPOSABLE) ×4 IMPLANT
DRAPE COLUMN DVNC XI (DISPOSABLE) ×1 IMPLANT
DRAPE DA VINCI XI ARM (DISPOSABLE) ×4
DRAPE DA VINCI XI COLUMN (DISPOSABLE) ×1
DRSG TEGADERM 8X12 (GAUZE/BANDAGES/DRESSINGS) ×1 IMPLANT
DURAPREP 26ML APPLICATOR (WOUND CARE) ×2 IMPLANT
ELECT REM PT RETURN 15FT ADLT (MISCELLANEOUS) ×2 IMPLANT
FILTER SMOKE EVAC LAPAROSHD (FILTER) ×1 IMPLANT
GLOVE BIO SURGEON STRL SZ7 (GLOVE) ×7 IMPLANT
GLOVE BIOGEL PI IND STRL 7.0 (GLOVE) ×2 IMPLANT
GLOVE BIOGEL PI INDICATOR 7.0 (GLOVE) ×2
IRRIG SUCT STRYKERFLOW 2 WTIP (MISCELLANEOUS) ×2
IRRIGATION SUCT STRKRFLW 2 WTP (MISCELLANEOUS) ×1 IMPLANT
LEGGING LITHOTOMY PAIR STRL (DRAPES) ×2 IMPLANT
MANIPULATOR ADVINCU DEL 2.5 PL (MISCELLANEOUS) IMPLANT
MANIPULATOR ADVINCU DEL 3.0 PL (MISCELLANEOUS) ×1 IMPLANT
MANIPULATOR ADVINCU DEL 3.5 PL (MISCELLANEOUS) IMPLANT
MANIPULATOR ADVINCU DEL 4.0 PL (MISCELLANEOUS) IMPLANT
NEEDLE INSUFFLATION 120MM (ENDOMECHANICALS) ×2 IMPLANT
OBTURATOR OPTICAL STANDARD 8MM (TROCAR) ×1
OBTURATOR OPTICAL STND 8 DVNC (TROCAR) ×1
OBTURATOR OPTICALSTD 8 DVNC (TROCAR) ×1 IMPLANT
PACK ROBOT WH (CUSTOM PROCEDURE TRAY) ×2 IMPLANT
PACK ROBOTIC GOWN (GOWN DISPOSABLE) ×2 IMPLANT
PACK TRENDGUARD 450 HYBRID PRO (MISCELLANEOUS) IMPLANT
PAD PREP 24X48 CUFFED NSTRL (MISCELLANEOUS) ×2 IMPLANT
PORT ACCESS TROCAR AIRSEAL 12 (TROCAR) IMPLANT
PORT ACCESS TROCAR AIRSEAL 5M (TROCAR) ×1
POSITIONER SURGICAL ARM (MISCELLANEOUS) ×4 IMPLANT
SEAL CANN UNIV 5-8 DVNC XI (MISCELLANEOUS) ×3 IMPLANT
SEAL XI 5MM-8MM UNIVERSAL (MISCELLANEOUS) ×3
SET CYSTO W/LG BORE CLAMP LF (SET/KITS/TRAYS/PACK) ×2 IMPLANT
SET TRI-LUMEN FLTR TB AIRSEAL (TUBING) ×2 IMPLANT
SUT DVC VLOC 180 0 12IN GS21 (SUTURE)
SUT VIC AB 2-0 CT1 27 (SUTURE) ×4
SUT VIC AB 2-0 CT1 27XBRD (SUTURE) IMPLANT
SUT VIC AB 2-0 CT2 27 (SUTURE) ×4 IMPLANT
SUT VIC AB 2-0 UR6 27 (SUTURE) ×1 IMPLANT
SUT VICRYL RAPIDE 3 0 (SUTURE) ×6 IMPLANT
SUT VLOC 180 0 9IN  GS21 (SUTURE) ×2
SUT VLOC 180 0 9IN GS21 (SUTURE) ×1 IMPLANT
SUTURE DVC VLC 180 0 12IN GS21 (SUTURE) IMPLANT
TOWEL OR 17X26 10 PK STRL BLUE (TOWEL DISPOSABLE) ×2 IMPLANT
TRENDGUARD 450 HYBRID PRO PACK (MISCELLANEOUS)
TROCAR PORT AIRSEAL 5X120 (TROCAR) ×2 IMPLANT
WATER STERILE IRR 1000ML POUR (IV SOLUTION) ×2 IMPLANT

## 2017-11-10 NOTE — OR Nursing (Signed)
Unable to reach family on initial call

## 2017-11-10 NOTE — OR Nursing (Signed)
Family updated on patient condition and surgery progress

## 2017-11-10 NOTE — Op Note (Signed)
11/10/2017  3:27 PM  PATIENT:  Sara Zuniga  42 y.o. female  PRE-OPERATIVE DIAGNOSIS:  Symptomatic MYOMA uterus  POST-OPERATIVE DIAGNOSIS:  MYOMA  PROCEDURE:   SURGEON:  Surgeon(s) and Role:    * Bobbye Charleston, MD - Primary    * Allyn Kenner, DO - Assisting  ANESTHESIA:   general  EBL:  500 mL   LOCAL MEDICATIONS USED:  LIDOCAINE  and OTHER Ropivicaine  SPECIMEN:  Source of Specimen:  Uterus, bilateral tubes, cervix, fibroids  DISPOSITION OF SPECIMEN:  PATHOLOGY  COUNTS:  YES  DICTATION: .Note written in EPIC  PLAN OF CARE: Admit for overnight observation  PATIENT DISPOSITION:  PACU - hemodynamically stable.   Delay start of Pharmacological VTE agent (>24hrs) due to surgical blood loss or risk of bleeding: not applicable   Complications:  Possible 2-3 fibroids left intra-abdominally.  Findings:  21 weeks size uterus.  Ovaries were normal.  The ureters were identified during multiple points of the case and were always out of the field of dissection.  On cystoscopy, the bladder was intact and bilateral spill was seen from each ureteral orriface.  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  The gall bladder was normal.   Medications:  Ancef.  Ropivicaine and lidocaine.  Pitressin.    Technique:   After adequate anesthesia was achieved the patient was positioned, prepped and draped in usual sterile fashion.  A speculum was placed in the vagina and the cervix dilated with pratt dilators.  The 3 cm Koh ring Advincula was assembled and placed in proper fashion.  The  Speculum was removed and the bladder catheterized with a foley.     Attention was turned to the abdomen where a 1 cm incision was made 3 cm below the rib line midclavicular.  The 5 mm optiview was introduced was introduced with direct visulization and the abdomen insufflated.  The other three trocar sites were marked out, all approximately 10 cm from each other and  the first port.  Three 8.5 mm trocars were placed in the sites under direct visualization and the optiview port then changed under direct view for the additional robotic trocar. The 10 mm assistant airseal port was placed 3 cm below the line of the other trocars.  All trocars were inserted under direct visualization of the camera.  The patient was placed in trendelenburg and then the Robot docked.  The fenestrated bipolar were placed on arm 1, the single tooth tenaculum on arm 4 the  and the Hot shears on arm 3 and introduced under direct visualization of the camera on arm 2.   I then broke scrub and sat down at the console.  The above findings were noted and the ureters identified well out of the field of dissection.  However, it was clear that manipulation to the cervix was going to be difficult with the fibroids in place.  A myomectomy was performed on 10 fibroids with the aid of pitressin. The fibroids were placed in the gutters while in trendelenburg.    The left fallopian tube was isolated and cauterized with the bipolar.  The Utero-ovarian ligament was then divided with the bipolar cautery and shears.  An incision was made parallel to the IP ligament and blunt dissection used to find the ureter.  The ureter was dissected and followed to the uterine artery.  The uterine artery at the level of the external os and well above the ureter was cauterized and divided.  The same was performed on the R but the ureter at the point of the uterine artery was obscured by a very large fibroid.  A careful myomectomy was then done to remove this fibroid and expose the ureter.  At this point, the ureter was followed and the uterine artery on this side also cauterized and ligated, well above the ureter.  The posterior cervix was entered into and carried to the sides.   The anterior leaf was then incised at the reflection of the vessico-uterine junction and the lateral bladder retracted inferiorly after the round  ligaments had been divided with the bipolar forceps.  The ureters were identified well out of the field of dissection.     The bladder was then able to be retracted inferiorly and the vesico-uterine fascia was incised in the midline until the bladder was removed one cm below the Koh ring.  The hot shears then continued  circumferentially incised the vagina at the level of the reflection on the [facility] ring.  Once the uterus and cervix were amputated, an additional several fibroids were removed by myomectomy in order to remove the uterus.  The uterus was removed and the additional fibroids removed through the vagina as well.    We then did several systematic surveys of the abdomen, raising and lowering in and out of trendelenburg to find the remaining fibroids.  An additional 8 fibroids were removed.  Inspection of all areas and above and below liver revealed no additional fibroids.  The official count was 15 but only 12 were counted out.  After considering all options including open lap to find the additional fibroids, we felt that the risks of small fibroids left in situ would be less than that of an open lap.  If any fibroids were left, they have to be very small.    The scissors were changed to the mega suture cut needle driver and the cuff was closed with 2 running stitches of 0-vicryl V loc. The ureters were peristalsing bilaterally well and very lateral to the areas of operation.     The Robot was then undocked and I scrubbed back in. The 10 mm airseal port site fascia was closed with a figure of 8 of 2-vicryl. The skin incisions were closed with subcuticular stitches of 3-0 vicryl Rapide and Dermabond.  Ropivicaine had not been introduced before closure of the skin on the smaller 8.5 and the fascia of the 10 mm port.  The needle for the pitressin was introduced two cm below the tented up fascia and the ropivicaine injected.  All instruments were removed from the vagina and cystoscopy performed, revealing  an intact bladder and vigourous spill of urine from each ureteral orifice.  The cystoscope was removed and the patient taken to the recovery room in stable condition.   Sara Luse A

## 2017-11-10 NOTE — OR Nursing (Signed)
Family updated.

## 2017-11-10 NOTE — Anesthesia Preprocedure Evaluation (Signed)
Anesthesia Evaluation  Patient identified by MRN, date of birth, ID band Patient awake    Reviewed: Allergy & Precautions, NPO status , Patient's Chart, lab work & pertinent test results  Airway Mallampati: II  TM Distance: >3 FB Neck ROM: Full    Dental no notable dental hx.    Pulmonary neg pulmonary ROS,    Pulmonary exam normal breath sounds clear to auscultation       Cardiovascular negative cardio ROS Normal cardiovascular exam Rhythm:Regular Rate:Normal     Neuro/Psych negative neurological ROS  negative psych ROS   GI/Hepatic negative GI ROS, Neg liver ROS,   Endo/Other  negative endocrine ROS  Renal/GU negative Renal ROS  negative genitourinary   Musculoskeletal negative musculoskeletal ROS (+)   Abdominal   Peds negative pediatric ROS (+)  Hematology  (+) anemia ,   Anesthesia Other Findings   Reproductive/Obstetrics negative OB ROS                             Anesthesia Physical Anesthesia Plan  ASA: II  Anesthesia Plan: General   Post-op Pain Management:    Induction: Intravenous  PONV Risk Score and Plan: 3 and Ondansetron, Dexamethasone, Treatment may vary due to age or medical condition and Scopolamine patch - Pre-op  Airway Management Planned: Oral ETT  Additional Equipment:   Intra-op Plan:   Post-operative Plan: Extubation in OR  Informed Consent: I have reviewed the patients History and Physical, chart, labs and discussed the procedure including the risks, benefits and alternatives for the proposed anesthesia with the patient or authorized representative who has indicated his/her understanding and acceptance.   Dental advisory given  Plan Discussed with: CRNA and Surgeon  Anesthesia Plan Comments:         Anesthesia Quick Evaluation

## 2017-11-10 NOTE — Progress Notes (Signed)
Dr. Philis Pique called to unit to follow up on patient condition. Discussed patient status, including recent reported emesis, state of drowsiness, pain control, refusal of protonix, and foley cather. Dr. Philis Pique stated to administer protonix as ordered and to d/c foley catheter in a.m. Also advised to notify MD with any changes in condition. Will continue to monitor.

## 2017-11-10 NOTE — Anesthesia Postprocedure Evaluation (Signed)
Anesthesia Post Note  Patient: Sara Zuniga  Procedure(s) Performed: XI ROBOTIC ASSISTED LAPAROSCOPIC HYSTERECTOMY AND BILATERAL  SALPINGECTOMY, MYOMECTOMY, CYSTOSCOPY (N/A Abdomen)     Patient location during evaluation: PACU Anesthesia Type: General Level of consciousness: awake and alert Pain management: pain level controlled Vital Signs Assessment: post-procedure vital signs reviewed and stable Respiratory status: spontaneous breathing, nonlabored ventilation, respiratory function stable and patient connected to nasal cannula oxygen Cardiovascular status: blood pressure returned to baseline and stable Postop Assessment: no apparent nausea or vomiting Anesthetic complications: no    Last Vitals:  Vitals:   11/10/17 1630 11/10/17 1645  BP: 105/61 102/70  Pulse: 72 75  Resp: 12 12  Temp:  36.5 C  SpO2: 100% 95%    Last Pain:  Vitals:   11/10/17 1630  TempSrc:   PainSc: Asleep                 Japji Kok S

## 2017-11-10 NOTE — Progress Notes (Signed)
There has been no change in the patients history, status or exam since the history and physical.  Vitals:   11/10/17 0606  BP: 116/83  Pulse: 78  Resp: 16  Temp: 98.3 F (36.8 C)  TempSrc: Oral  SpO2: 100%  Weight: 74.6 kg  Height: 5\' 9"  (1.753 m)    Results for orders placed or performed during the hospital encounter of 11/10/17 (from the past 72 hour(s))  Pregnancy, urine     Status: None   Collection Time: 11/10/17  5:45 AM  Result Value Ref Range   Preg Test, Ur NEGATIVE NEGATIVE    Comment:        THE SENSITIVITY OF THIS METHODOLOGY IS >20 mIU/mL. Performed at [facility], [place] [address], [place], [place] [address]   CBC     Status: Abnormal   Collection Time: 11/10/17  6:29 AM  Result Value Ref Range   WBC 2.1 (L) 4.0 - 10.5 K/uL   RBC 4.05 3.87 - 5.11 MIL/uL   Hemoglobin 10.6 (L) 12.0 - 15.0 g/dL   HCT 34.4 (L) 36.0 - 46.0 %   MCV 84.9 80.0 - 100.0 fL   MCH 26.2 26.0 - 34.0 pg   MCHC 30.8 30.0 - 36.0 g/dL   RDW 15.9 (H) 11.5 - 15.5 %   Platelets 321 150 - 400 K/uL   nRBC 0.0 0.0 - 0.2 %    Comment: Performed at [facility], [place] [address], [place], [place] [address]    [place]

## 2017-11-10 NOTE — OR Nursing (Signed)
Family updated on surgery progress and and patient condition

## 2017-11-10 NOTE — Brief Op Note (Signed)
11/10/2017  3:27 PM  PATIENT:  Sara Zuniga  42 y.o. female  PRE-OPERATIVE DIAGNOSIS:  MYOMA  POST-OPERATIVE DIAGNOSIS:  MYOMA  PROCEDURE:   SURGEON:  Surgeon(s) and Role:    * Bobbye Charleston, MD - Primary    * Allyn Kenner, DO - Assisting  ANESTHESIA:   general  EBL:  500 mL   LOCAL MEDICATIONS USED:  LIDOCAINE  and OTHER Ropivicaine  SPECIMEN:  Source of Specimen:  Uterus, bilateral tubes, cervix, fibroids  DISPOSITION OF SPECIMEN:  PATHOLOGY  COUNTS:  YES  TOURNIQUET:  * No tourniquets in log *  DICTATION: .Note written in EPIC  PLAN OF CARE: Admit for overnight observation  PATIENT DISPOSITION:  PACU - hemodynamically stable.   Delay start of Pharmacological VTE agent (>24hrs) due to surgical blood loss or risk of bleeding: not applicable

## 2017-11-10 NOTE — Anesthesia Procedure Notes (Signed)
Procedure Name: Intubation Date/Time: 11/10/2017 7:39 AM Performed by: Gwyndolyn Saxon, CRNA Pre-anesthesia Checklist: Patient identified, Emergency Drugs available, Suction available and Patient being monitored Patient Re-evaluated:Patient Re-evaluated prior to induction Oxygen Delivery Method: Circle system utilized Preoxygenation: Pre-oxygenation with 100% oxygen Induction Type: IV induction Ventilation: Mask ventilation without difficulty Laryngoscope Size: Miller and 2 Grade View: Grade I Tube type: Oral Tube size: 7.0 mm Number of attempts: 1 Placement Confirmation: ETT inserted through vocal cords under direct vision,  positive ETCO2 and breath sounds checked- equal and bilateral Secured at: 21 cm Tube secured with: Tape Dental Injury: Teeth and Oropharynx as per pre-operative assessment

## 2017-11-10 NOTE — Transfer of Care (Signed)
Immediate Anesthesia Transfer of Care Note  Patient: Sara Zuniga  Procedure(s) Performed: XI ROBOTIC ASSISTED LAPAROSCOPIC HYSTERECTOMY AND BILATERAL  SALPINGECTOMY, MYOMECTOMY, CYSTOSCOPY (N/A Abdomen)  Patient Location: PACU  Anesthesia Type:General  Level of Consciousness: drowsy  Airway & Oxygen Therapy: Patient Spontanous Breathing and Patient connected to face mask oxygen  Post-op Assessment: Report given to RN and Post -op Vital signs reviewed and stable  Post vital signs: Reviewed and stable  Last Vitals:  Vitals Value Taken Time  BP 84/74 11/10/2017  3:45 PM  Temp    Pulse 82 11/10/2017  3:47 PM  Resp 20 11/10/2017  3:46 PM  SpO2 100 % 11/10/2017  3:47 PM  Vitals shown include unvalidated device data.  Last Pain:  Vitals:   11/10/17 0606  TempSrc: Oral  PainSc: 0-No pain      Patients Stated Pain Goal: 4 (24/11/46 4314)  Complications: No apparent anesthesia complications

## 2017-11-11 ENCOUNTER — Encounter (HOSPITAL_COMMUNITY): Payer: Self-pay | Admitting: Obstetrics and Gynecology

## 2017-11-11 DIAGNOSIS — D259 Leiomyoma of uterus, unspecified: Secondary | ICD-10-CM | POA: Diagnosis not present

## 2017-11-11 LAB — CBC
HEMATOCRIT: 19.1 % — AB (ref 36.0–46.0)
HEMATOCRIT: 17.8 % — AB (ref 36.0–46.0)
HEMATOCRIT: 24 % — AB (ref 36.0–46.0)
HEMOGLOBIN: 5.4 g/dL — AB (ref 12.0–15.0)
HEMOGLOBIN: 7.5 g/dL — AB (ref 12.0–15.0)
Hemoglobin: 5.9 g/dL — CL (ref 12.0–15.0)
MCH: 26.2 pg (ref 26.0–34.0)
MCH: 25.7 pg — AB (ref 26.0–34.0)
MCH: 27 pg (ref 26.0–34.0)
MCHC: 30.9 g/dL (ref 30.0–36.0)
MCHC: 30.3 g/dL (ref 30.0–36.0)
MCHC: 31.3 g/dL (ref 30.0–36.0)
MCV: 84.9 fL (ref 80.0–100.0)
MCV: 84.8 fL (ref 80.0–100.0)
MCV: 86.3 fL (ref 80.0–100.0)
NRBC: 0 % (ref 0.0–0.2)
Platelets: 202 10*3/uL (ref 150–400)
Platelets: 177 10*3/uL (ref 150–400)
Platelets: 177 10*3/uL (ref 150–400)
RBC: 2.25 MIL/uL — AB (ref 3.87–5.11)
RBC: 2.1 MIL/uL — ABNORMAL LOW (ref 3.87–5.11)
RBC: 2.78 MIL/uL — AB (ref 3.87–5.11)
RDW: 16 % — ABNORMAL HIGH (ref 11.5–15.5)
RDW: 16.2 % — ABNORMAL HIGH (ref 11.5–15.5)
RDW: 16.1 % — ABNORMAL HIGH (ref 11.5–15.5)
WBC: 7.7 10*3/uL (ref 4.0–10.5)
WBC: 6.8 10*3/uL (ref 4.0–10.5)
WBC: 8.4 10*3/uL (ref 4.0–10.5)
nRBC: 0 % (ref 0.0–0.2)
nRBC: 0 % (ref 0.0–0.2)

## 2017-11-11 LAB — COMPREHENSIVE METABOLIC PANEL
ALT: 14 U/L (ref 0–44)
AST: 27 U/L (ref 15–41)
Albumin: 2.6 g/dL — ABNORMAL LOW (ref 3.5–5.0)
Alkaline Phosphatase: 24 U/L — ABNORMAL LOW (ref 38–126)
Anion gap: 5 (ref 5–15)
BUN: 10 mg/dL (ref 6–20)
CHLORIDE: 110 mmol/L (ref 98–111)
CO2: 23 mmol/L (ref 22–32)
Calcium: 7.8 mg/dL — ABNORMAL LOW (ref 8.9–10.3)
Creatinine, Ser: 0.81 mg/dL (ref 0.44–1.00)
GFR calc Af Amer: >60 mL/min (ref >60)
GLUCOSE: 146 mg/dL — AB (ref 70–99)
POTASSIUM: 3.8 mmol/L (ref 3.5–5.1)
SODIUM: 138 mmol/L (ref 135–145)
Total Bilirubin: 0.7 mg/dL (ref 0.3–1.2)
Total Protein: 4.9 g/dL — ABNORMAL LOW (ref 6.5–8.1)

## 2017-11-11 LAB — PREPARE RBC (CROSSMATCH)

## 2017-11-11 LAB — POCT HEMOGLOBIN-HEMACUE: HEMOGLOBIN: 4.2 g/dL — AB (ref 12.0–15.0)

## 2017-11-11 MED ORDER — DIPHENHYDRAMINE HCL 25 MG PO CAPS: 25.0000 mg | ORAL_CAPSULE | Freq: Once | ORAL | Status: DC

## 2017-11-11 MED ORDER — SODIUM CHLORIDE 0.9% IV SOLUTION: Freq: Once | INTRAVENOUS | Status: DC

## 2017-11-11 MED ORDER — DEXTROSE 5 % IN LACTATED RINGERS IV BOLUS: 500.0000 mL | Freq: Once | INTRAVENOUS | Status: DC

## 2017-11-11 MED ORDER — SODIUM CHLORIDE 0.9 % IV SOLN
INTRAVENOUS | Status: DC
  Administered 2017-11-11: 09:00:00 via INTRAVENOUS

## 2017-11-11 MED ORDER — KETOROLAC TROMETHAMINE 30 MG/ML IJ SOLN
INTRAMUSCULAR | Status: AC
  Filled 2017-11-11: qty 1

## 2017-11-11 MED ORDER — OXYCODONE-ACETAMINOPHEN 5-325 MG PO TABS
ORAL_TABLET | ORAL | Status: AC
  Filled 2017-11-11: qty 1

## 2017-11-11 NOTE — Progress Notes (Signed)
Upon reviewing labs, RN noted CBC still in process since drawn at 0204 a.m. Lab called, Sara Zuniga in lab states critical value of Hgb 5.9 and lab was not called d/t staff in lab "unaware of what phone number to call to notify the nurse". VS obtained, patient remains tachycardic. Denies acute pain. Dizzy upon standing from bed. Will notify MD and continue to monitor.

## 2017-11-11 NOTE — Progress Notes (Signed)
Dr. Philis Pique notified of patient's Hgb of 5.9 at 0204 per lab and hemocue of 4.2 per nursing staff at 0420 . Discussed patient's status, including drowsy state, pallor, continued tachycardia. New orders obtained for stat CBC, D5LR 500 ml bolus, and MD will come to unit to assess patient. Orders enacted, patient updated, will continue to monitor.

## 2017-11-11 NOTE — Progress Notes (Signed)
Pt transferred to [facility] room 1534 via wheelchair. Patient condition stable, blood product started prior to transfer to floor, no adverse reactions. Patient states family took belongings home prior to surgery. Patient placed in bed in lowest position with call bell in reach. Report given to day shift RN at beside. Patient resting comfortably. Lenna Sciara, RN

## 2017-11-11 NOTE — Progress Notes (Signed)
Patient is eating, ambulating, not voiding-foley in.  Pain control is good right now.  Pt c/o some epigastric pain earlier and took percocet.  Pt tolerating pain meds by mouth.  Pt feels drowsy but arousable and alert and oriented.  She vomited once last night but has not had nausea or vomiting since. Pt was dizzy on standing earlier.  Vitals:   11/11/17 0218 11/11/17 0418 11/11/17 0516 11/11/17 0521  BP: (!) 103/57 105/63 (!) 107/53 105/61  Pulse: (!) 105 (!) 105 94 (!) 108  Resp: 15 15 14    Temp: 100.1 F (37.8 C) 99.5 F (37.5 C) 99.2 F (37.3 C)   TempSrc:      SpO2: 99% 100% 100% 100%  Weight:      Height:        lungs:   clear to auscultation bilaterally. cor:    RRR but slightly tachy Abdomen:  soft, appropriate tenderness, incisions intact and without erythema or exudate. ex:    no cords   I postop- 300 cc/ O postop - 650 urine, 150 emesis;  Totals 5000 in/1900 out. Urine 125 cc over 2 hours  Results for orders placed or performed during the hospital encounter of 11/10/17 (from the past 24 hour(s))  Pregnancy, urine     Status: None   Collection Time: 11/10/17  5:45 AM  Result Value Ref Range   Preg Test, Ur NEGATIVE NEGATIVE  CBC     Status: Abnormal   Collection Time: 11/10/17  6:29 AM  Result Value Ref Range   WBC 2.1 (L) 4.0 - 10.5 K/uL   RBC 4.05 3.87 - 5.11 MIL/uL   Hemoglobin 10.6 (L) 12.0 - 15.0 g/dL   HCT 34.4 (L) 36.0 - 46.0 %   MCV 84.9 80.0 - 100.0 fL   MCH 26.2 26.0 - 34.0 pg   MCHC 30.8 30.0 - 36.0 g/dL   RDW 15.9 (H) 11.5 - 15.5 %   Platelets 321 150 - 400 K/uL   nRBC 0.0 0.0 - 0.2 %  Type and screen [place]     Status: None (Preliminary result)   Collection Time: 11/10/17  7:40 AM  Result Value Ref Range   ABO/RH(D) A POS    Antibody Screen NEG    Sample Expiration      11/13/2017 Performed at [facility], [place] [address] [place], [place] [address]    Unit Number [id number]    Blood Component  Type RED CELLS,LR    Unit division 00    Status of Unit ALLOCATED    Transfusion Status OK TO TRANSFUSE    Crossmatch Result Compatible    Unit Number [id number]    Blood Component Type RED CELLS,LR    Unit division 00    Status of Unit ALLOCATED    Transfusion Status OK TO TRANSFUSE    Crossmatch Result Compatible   ABO/Rh     Status: None   Collection Time: 11/10/17  7:40 AM  Result Value Ref Range   ABO/RH(D)      A POS Performed at [facility], [place] [address], [place], [place] [address]   CBC     Status: Abnormal   Collection Time: 11/11/17  2:04 AM  Result Value Ref Range   WBC 7.7 4.0 - 10.5 K/uL   RBC 2.25 (L) 3.87 - 5.11 MIL/uL   Hemoglobin 5.9 (LL) 12.0 - 15.0 g/dL   HCT 19.1 (L) 36.0 - 46.0 %   MCV 84.9  80.0 - 100.0 fL   MCH 26.2 26.0 - 34.0 pg   MCHC 30.9 30.0 - 36.0 g/dL   RDW 16.0 (H) 11.5 - 15.5 %   Platelets 202 150 - 400 K/uL   nRBC 0.0 0.0 - 0.2 %  Comprehensive metabolic panel     Status: Abnormal   Collection Time: 11/11/17  2:04 AM  Result Value Ref Range   Sodium 138 135 - 145 mmol/L   Potassium 3.8 3.5 - 5.1 mmol/L   Chloride 110 98 - 111 mmol/L   CO2 23 22 - 32 mmol/L   Glucose, Bld 146 (H) 70 - 99 mg/dL   BUN 10 6 - 20 mg/dL   Creatinine, Ser 0.81 0.44 - 1.00 mg/dL   Calcium 7.8 (L) 8.9 - 10.3 mg/dL   Total Protein 4.9 (L) 6.5 - 8.1 g/dL   Albumin 2.6 (L) 3.5 - 5.0 g/dL   AST 27 15 - 41 U/L   ALT 14 0 - 44 U/L   Alkaline Phosphatase 24 (L) 38 - 126 U/L   Total Bilirubin 0.7 0.3 - 1.2 mg/dL   GFR calc non Af Amer >60 >60 mL/min   GFR calc Af Amer >60 >60 mL/min   Anion gap 5 5 - 15  Hemoglobin-hemacue, POC     Status: Abnormal   Collection Time: 11/11/17  4:16 AM  Result Value Ref Range   Hemoglobin 4.2 (LL) 12.0 - 15.0 g/dL  CBC     Status: Abnormal   Collection Time: 11/11/17  4:41 AM  Result Value Ref Range   WBC 6.8 4.0 - 10.5 K/uL   RBC 2.10 (L) 3.87 - 5.11 MIL/uL   Hemoglobin 5.4 (LL) 12.0 - 15.0 g/dL    HCT 17.8 (L) 36.0 - 46.0 %   MCV 84.8 80.0 - 100.0 fL   MCH 25.7 (L) 26.0 - 34.0 pg   MCHC 30.3 30.0 - 36.0 g/dL   RDW 16.2 (H) 11.5 - 15.5 %   Platelets 177 150 - 400 K/uL   nRBC 0.0 0.0 - 0.2 %   A/P  Postoperative TLH/salpingectomies/cysto.  Likely we underestimated her blood loss during surgery.  H/H is stable from 2 am at 5.9 to now at 5.4.  Her LFTs are normal and her Bun/Cr 10/0.81.  Pt had vitals sitting at bedside and is stable but still mildly tachycardic.    Pt will need longer recovery time than [facility] can do.   D/w pt the risks and benefits of blood transfusion for symptoms of anemia and recovery time and she agrees to proceed.  Will start one unit of PRBCs and transfer to [place] for patient to go home tomorrow.  Hallel Denherder A

## 2017-11-12 DIAGNOSIS — D259 Leiomyoma of uterus, unspecified: Secondary | ICD-10-CM | POA: Diagnosis not present

## 2017-11-12 MED ORDER — OXYCODONE-ACETAMINOPHEN 5-325 MG PO TABS: 2.0000 | ORAL_TABLET | ORAL | 0 refills | Status: DC | PRN

## 2017-11-12 MED ORDER — PANTOPRAZOLE SODIUM 40 MG PO TBEC: 40.0000 mg | DELAYED_RELEASE_TABLET | Freq: Every day | ORAL | 1 refills | Status: DC

## 2017-11-12 MED ORDER — IBUPROFEN 800 MG PO TABS: 800.0000 mg | ORAL_TABLET | Freq: Three times a day (TID) | ORAL | 0 refills | Status: DC | PRN

## 2017-11-12 MED ORDER — FERROUS SULFATE 325 (65 FE) MG PO TABS: 325.0000 mg | ORAL_TABLET | Freq: Every day | ORAL | 3 refills | Status: DC

## 2017-11-12 NOTE — Discharge Summary (Signed)
Physician Discharge Summary  Patient ID: Sara Zuniga MRN: [id number] DOB/AGE: 42-Aug-1977 42 y.o.  Admit date: 11/10/2017 Discharge date: 11/12/2017  Admission Diagnoses:symptomatic fibroid uterus  Discharge Diagnoses: same plus post op anemia Active Problems:   Postoperative state   Discharged Condition: good  Hospital Course: Pt underwent robtotic TLH, salpingectomies, cystoscopy.  There is a potential that two small fibroids were left in the abdomen but they could not be found with appropriate actions.  Pt postoperative had a drop in H/H to 5.4/19 and was symptomatic.  She received a unit of PRBCS and her symptoms resolved and her d/c H/H was 7.5 and 24,  Other than being kept for 2 days postop, her course was otherwise uncomplicated.   Consults: None  Significant Diagnostic Studies: labs:  Results for orders placed or performed during the hospital encounter of 11/10/17 (from the past 48 hour(s))  Type and screen [place]     Status: None (Preliminary result)   Collection Time: 11/10/17  7:40 AM  Result Value Ref Range   ABO/RH(D) A POS    Antibody Screen NEG    Sample Expiration 11/13/2017    Unit Number [id number]    Blood Component Type RED CELLS,LR    Unit division 00    Status of Unit ISSUED    Transfusion Status OK TO TRANSFUSE    Crossmatch Result      Compatible Performed at [facility], [place] [address], [place], [place] [address]    Unit Number [id number]    Blood Component Type RED CELLS,LR    Unit division 00    Status of Unit ALLOCATED    Transfusion Status OK TO TRANSFUSE    Crossmatch Result Compatible   ABO/Rh     Status: None   Collection Time: 11/10/17  7:40 AM  Result Value Ref Range   ABO/RH(D)      A POS Performed at [facility], [place] [address], [place], [place] [address]   CBC     Status: Abnormal   Collection Time: 11/11/17  2:04 AM  Result Value Ref Range   WBC 7.7 4.0  - 10.5 K/uL   RBC 2.25 (L) 3.87 - 5.11 MIL/uL   Hemoglobin 5.9 (LL) 12.0 - 15.0 g/dL    Comment: REPEATED TO VERIFY DELTA CHECK NOTED THIS CRITICAL RESULT HAS VERIFIED AND BEEN CALLED TO L MERRILL RN BY AMY NAVARRO ON 10 24 2019 AT 0412, AND HAS BEEN READ BACK. CALLED FROM KELLY TIBBITTS    HCT 19.1 (L) 36.0 - 46.0 %   MCV 84.9 80.0 - 100.0 fL   MCH 26.2 26.0 - 34.0 pg   MCHC 30.9 30.0 - 36.0 g/dL   RDW 16.0 (H) 11.5 - 15.5 %   Platelets 202 150 - 400 K/uL   nRBC 0.0 0.0 - 0.2 %    Comment: Performed at [facility], [place] [address] [place], [place] [address]  Comprehensive metabolic panel     Status: Abnormal   Collection Time: 11/11/17  2:04 AM  Result Value Ref Range   Sodium 138 135 - 145 mmol/L   Potassium 3.8 3.5 - 5.1 mmol/L   Chloride 110 98 - 111 mmol/L   CO2 23 22 - 32 mmol/L   Glucose, Bld 146 (H) 70 - 99 mg/dL   BUN 10 6 - 20 mg/dL   Creatinine, Ser 0.81 0.44 - 1.00 mg/dL   Calcium 7.8 (L) 8.9 - 10.3 mg/dL  Total Protein 4.9 (L) 6.5 - 8.1 g/dL   Albumin 2.6 (L) 3.5 - 5.0 g/dL   AST 27 15 - 41 U/L   ALT 14 0 - 44 U/L   Alkaline Phosphatase 24 (L) 38 - 126 U/L   Total Bilirubin 0.7 0.3 - 1.2 mg/dL   GFR calc non Af Amer >60 >60 mL/min   GFR calc Af Amer >60 >60 mL/min    Comment: (NOTE) The eGFR has been calculated using the CKD EPI equation. This calculation has not been validated in all clinical situations. eGFR's persistently <60 mL/min signify possible Chronic Kidney Disease.    Anion gap 5 5 - 15    Comment: Performed at [facility], [place] [address], [place], [place] [address]  Hemoglobin-hemacue, POC     Status: Abnormal   Collection Time: 11/11/17  4:16 AM  Result Value Ref Range   Hemoglobin 4.2 (LL) 12.0 - 15.0 g/dL  CBC     Status: Abnormal   Collection Time: 11/11/17  4:41 AM  Result Value Ref Range   WBC 6.8 4.0 - 10.5 K/uL   RBC 2.10 (L) 3.87 - 5.11 MIL/uL   Hemoglobin 5.4 (LL) 12.0 - 15.0 g/dL     Comment: CRITICAL VALUE NOTED.  VALUE IS CONSISTENT WITH PREVIOUSLY REPORTED AND CALLED VALUE.   HCT 17.8 (L) 36.0 - 46.0 %   MCV 84.8 80.0 - 100.0 fL   MCH 25.7 (L) 26.0 - 34.0 pg   MCHC 30.3 30.0 - 36.0 g/dL   RDW 16.2 (H) 11.5 - 15.5 %   Platelets 177 150 - 400 K/uL   nRBC 0.0 0.0 - 0.2 %    Comment: Performed at [facility], [place] [address], [place], [place] [address]  Prepare RBC     Status: None   Collection Time: 11/11/17  6:01 AM  Result Value Ref Range   Order Confirmation      ORDER PROCESSED BY BLOOD BANK Performed at [place] [address], [place], [place] [address]   CBC     Status: Abnormal   Collection Time: 11/11/17  2:45 PM  Result Value Ref Range   WBC 8.4 4.0 - 10.5 K/uL   RBC 2.78 (L) 3.87 - 5.11 MIL/uL   Hemoglobin 7.5 (L) 12.0 - 15.0 g/dL    Comment: REPEATED TO VERIFY POST TRANSFUSION SPECIMEN    HCT 24.0 (L) 36.0 - 46.0 %   MCV 86.3 80.0 - 100.0 fL   MCH 27.0 26.0 - 34.0 pg   MCHC 31.3 30.0 - 36.0 g/dL   RDW 16.1 (H) 11.5 - 15.5 %   Platelets 177 150 - 400 K/uL   nRBC 0.0 0.0 - 0.2 %    Comment: Performed at [facility], [place] [address], [place], [place] [address]    Treatments: surgery:  robtotic TLH, salpingectomies, cystoscopy.  Blood transfusion 1 unit PRBCs.  Discharge Exam: Blood pressure 100/60, pulse 73, temperature 98.8 F (37.1 C), temperature source Oral, resp. rate 18, height 5' 9"  (1.753 m), weight 74.6 kg, last menstrual period 10/09/2017, SpO2 95 %.   Disposition: Discharge disposition: 01-Home or Self Care       Discharge Instructions    Call MD for:  temperature >100.4   Complete by:  As directed    Diet - low sodium heart healthy   Complete by:  As directed    Discharge instructions   Complete by:  As directed  No driving on narcotics, no sexual activity for 2 weeks.   Increase activity slowly   Complete by:  As directed    May shower / Bathe    Complete by:  As directed    Shower, no bath for 2 weeks.   Remove dressing in 24 hours   Complete by:  As directed    Sexual Activity Restrictions   Complete by:  As directed    No sexual activity for 2 weeks.     Allergies as of 11/12/2017   No Known Allergies     Medication List    TAKE these medications   ferrous sulfate 325 (65 FE) MG tablet Take 1 tablet (325 mg total) by mouth daily with breakfast.   ibuprofen 800 MG tablet Commonly known as:  ADVIL,MOTRIN Take 1 tablet (800 mg total) by mouth every 8 (eight) hours as needed (mild pain).   oxyCODONE-acetaminophen 5-325 MG tablet Commonly known as:  PERCOCET/ROXICET Take 2 tablets by mouth every 4 (four) hours as needed for severe pain ((when tolerating fluids)).   pantoprazole 40 MG tablet Commonly known as:  PROTONIX Take 1 tablet (40 mg total) by mouth daily.      Follow-up Information    Bobbye Charleston, MD Follow up in 4 week(s).   Specialty:  Obstetrics and Gynecology Contact information: [address] [place] [place] [address] [phone]           Signed: Carlin Mamone A 11/12/2017, 7:24 AM

## 2017-11-12 NOTE — Progress Notes (Signed)
Patient discharged to home w/ family. Given all belongings, instructions, prescriptions. Patient verbalized understanding of instructions. Escorted to pov via w/c.

## 2017-11-12 NOTE — Progress Notes (Signed)
Patient is eating, ambulating, and voiding.  Pain control is good.  Vitals:   11/11/17 1810 11/11/17 2201 11/12/17 0146 11/12/17 0647  BP: 105/67 108/60 (!) 103/57 100/60  Pulse: 88 91 75 73  Resp: 18 18 18 18   Temp: 98.7 F (37.1 C) 99.3 F (37.4 C) 99.5 F (37.5 C) 98.8 F (37.1 C)  TempSrc: Oral Oral Oral Oral  SpO2: 94% 97% 99% 95%  Weight:      Height:        lungs:   clear to auscultation cor:    RRR Abdomen:  soft, appropriate tenderness, incisions intact and without erythema or exudate. ex:    no cords   Lab Results  Component Value Date   WBC 8.4 11/11/2017   HGB 7.5 (L) 11/11/2017   HCT 24.0 (L) 11/11/2017   MCV 86.3 11/11/2017   PLT 177 11/11/2017    A/P  Routine care.  Expect d/c per plan.  Pt's H/H up to 7.5/24 after one unit and sx much better.  Pt stayed extra day to make sure vitals stable and pt eating and voiding.  Pain controlled with only PO.  D/C to home with iron and percocet.  RTC 2 weeks for incisions check.

## 2017-11-12 NOTE — Progress Notes (Signed)
Addendum to OP note.  The efforts made to find the "missing" 3 fibroids were intensive- pt was moved in and out of trendelenburg and reverse at least 4 times.  She was also rolled to the right side once. A bowel grasper was used to run the bowel.  A careful look under and over liver (one fibroid was found there) was undertaken multiple times while making sure we did not touch the liver.    At the end of the case, two fibroid pieces were counted by the tech as one fibroid.  However, I believe that I would have counted these as two pieces in stead of one.  Also, there was a change in scrub techs at least twice during the process of taking the fibroids out and I believe that the most likely explanation for the difference between the counts was a miss count instead of fibroids left inside. I will be actively working on a solution to making sure future counts are accurately done.

## 2017-11-14 LAB — TYPE AND SCREEN
ABO/RH(D): A POS
ANTIBODY SCREEN: NEGATIVE
Unit division: 0
Unit division: 0

## 2017-11-14 LAB — BPAM RBC
BLOOD PRODUCT EXPIRATION DATE: 201911222359
BLOOD PRODUCT EXPIRATION DATE: 201911222359
ISSUE DATE / TIME: 201910240613
UNIT TYPE AND RH: 6200
Unit Type and Rh: 6200

## 2018-01-07 DIAGNOSIS — D649 Anemia, unspecified: Secondary | ICD-10-CM | POA: Diagnosis not present

## 2018-04-15 ENCOUNTER — Telehealth: Payer: 59 | Admitting: Nurse Practitioner

## 2018-04-15 DIAGNOSIS — R05 Cough: Secondary | ICD-10-CM

## 2018-04-15 DIAGNOSIS — R059 Cough, unspecified: Secondary | ICD-10-CM

## 2018-04-15 NOTE — Progress Notes (Signed)

## 2018-10-11 DIAGNOSIS — Z01419 Encounter for gynecological examination (general) (routine) without abnormal findings: Secondary | ICD-10-CM | POA: Diagnosis not present

## 2018-11-23 ENCOUNTER — Ambulatory Visit (INDEPENDENT_AMBULATORY_CARE_PROVIDER_SITE_OTHER)
Admission: RE | Admit: 2018-11-23 | Discharge: 2018-11-23 | Disposition: A | Discharge status: Home / Self Care | Payer: 59 | Source: Ambulatory Visit

## 2018-11-23 DIAGNOSIS — M79672 Pain in left foot: Secondary | ICD-10-CM

## 2018-11-23 DIAGNOSIS — S99922A Unspecified injury of left foot, initial encounter: Secondary | ICD-10-CM

## 2018-11-23 MED ORDER — IBUPROFEN 800 MG PO TABS: 800.0000 mg | ORAL_TABLET | Freq: Three times a day (TID) | ORAL | 0 refills | Status: DC

## 2018-11-23 NOTE — ED Provider Notes (Signed)
Virtual Visit via Video Note:  Sara Zuniga  initiated request for Telemedicine visit with [facility] team. I connected with Sara Zuniga  on 11/23/2018 at 1:10 PM  for a synchronized telemedicine visit using a video enabled HIPPA compliant telemedicine application. I verified that I am speaking with Sara Zuniga  using two identifiers. Hallie C Wieters, PA-C  was physically located in a [facility] site and Sara Zuniga was located at a different location.   The limitations of evaluation and management by telemedicine as well as the availability of in-person appointments were discussed. Patient was informed that she  may incur a bill ( including co-pay) for this virtual visit encounter. Sara Zuniga  expressed understanding and gave verbal consent to proceed with virtual visit.     History of Present Illness:Sara Zuniga  is a 43 y.o. female presents for evaluation of left foot injury.  Patient states yesterday she was jumping around and accidentally fell onto her left foot.  She denies rolling her ankle.  Denies feeling any sensation of popping, had some mild discomfort afterwards, but this morning when she woke up she had increased pain and pain with weightbearing.  She states that she has a remote history of previous ankle fracture on this side approximately 20 years ago, did not require surgery.    No Known Allergies   Past Medical History:  Diagnosis Date  . Anemia   . Fibroids   . GERD (gastroesophageal reflux disease)    occ     Social History   Tobacco Use  . Smoking status: Never Smoker  . Smokeless tobacco: Never Used  Substance Use Topics  . Alcohol use: Yes    Comment: weekends  . Drug use: No        Observations/Objective: Physical Exam  Constitutional: She is oriented to person, place, and time and well-developed, well-nourished, and in no distress. No distress.  Neck: Normal range of motion.  Pulmonary/Chest:  Effort normal. No respiratory distress.  Speaking in full sentences  Musculoskeletal:     Comments: No obvious swelling or bruising noted, patient reports his tube proximal/lateral aspect of dorsum of foot as area of pain, and nontender when patient touches her medial and lateral malleolus as well as distal metatarsals  Neurological: She is alert and oriented to person, place, and time.  Speech clear, face symmetric     Assessment and Plan:    ICD-10-CM   1. Foot injury, left, initial encounter  [id number]     Likely sprain of foot, discussed cannot fully rule out fracture without imaging.  Given mechanism of injury, without significant immediate pain, feel sprain is more likely.  Recommending rest, ice, elevation, anti-inflammatories.  Ace wrap.  Follow-up in person if not seeing any improvement and pain with weightbearing over the next 24 to 48 hours.  Discussed strict return precautions. Patient verbalized understanding and is agreeable with plan.    Follow Up Instructions:     I discussed the assessment and treatment plan with the patient. The patient was provided an opportunity to ask questions and all were answered. The patient agreed with the plan and demonstrated an understanding of the instructions.   The patient was advised to call back or seek an in-person evaluation if the symptoms worsen or if the condition fails to improve as anticipated.      Hallie C Wieters, PA-C  11/23/2018 1:10 PM  Sara Lima, PA-C 11/23/18 1311

## 2018-11-23 NOTE — Discharge Instructions (Signed)
Use anti-inflammatories for pain/swelling. You may take up to 800 mg Ibuprofen every 8 hours with food. You may supplement Ibuprofen with Tylenol [phone] mg every 8 hours.  Ice and elevate May use ace wrap Follow up in person for xray if not reoslving on its own

## 2018-11-30 DIAGNOSIS — Z01419 Encounter for gynecological examination (general) (routine) without abnormal findings: Secondary | ICD-10-CM | POA: Diagnosis not present

## 2018-11-30 DIAGNOSIS — Z1231 Encounter for screening mammogram for malignant neoplasm of breast: Secondary | ICD-10-CM | POA: Diagnosis not present

## 2019-05-12 ENCOUNTER — Ambulatory Visit: Payer: 59 | Attending: Internal Medicine

## 2019-05-12 DIAGNOSIS — Z23 Encounter for immunization: Secondary | ICD-10-CM

## 2019-05-12 NOTE — Progress Notes (Signed)
   Covid-19 Vaccination Clinic  Name:  Sara Zuniga    MRN: [id number] DOB: 10-Jun-1975  05/12/2019  Sara Zuniga was observed post Covid-19 immunization for 15 minutes without incident. She was provided with Vaccine Information Sheet and instruction to access the V-Safe system.   Ms. Ninan was instructed to call 911 with any severe reactions post vaccine: Marland Kitchen Difficulty breathing  . Swelling of face and throat  . A fast heartbeat  . A bad rash all over body  . Dizziness and weakness   Immunizations Administered    Name Date Dose VIS Date Route   Pfizer COVID-19 Vaccine 05/12/2019  4:23 PM 0.3 mL 03/15/2018 Intramuscular   Manufacturer: [employer]   Lot: [id number]   [place]: [id number]

## 2019-07-20 DIAGNOSIS — N762 Acute vulvitis: Secondary | ICD-10-CM | POA: Diagnosis not present

## 2019-08-18 IMAGING — MR MR PELVIS WO/W CM
5 of 12 series · 20 of 48 positions shown · IV contrast (multihance)
Comparison: Acute abdominal series done 06/16/04

CLINICAL DATA: Pelvic pain. History of fibroids for 3 years.
Previous tubal ligation.

EXAM:
MRI PELVIS WITHOUT AND WITH CONTRAST
TECHNIQUE: Multiplanar multisequence MR imaging of the pelvis was performed
both before and after administration of intravenous contrast.
CONTRAST:  15mL MULTIHANCE GADOBENATE DIMEGLUMINE 529 MG/ML IV SOLN

[Series 3: T2 · coronal · 5.0mm · 0.78mm/px · 4 of 33 slices shown]
[im 1/33]
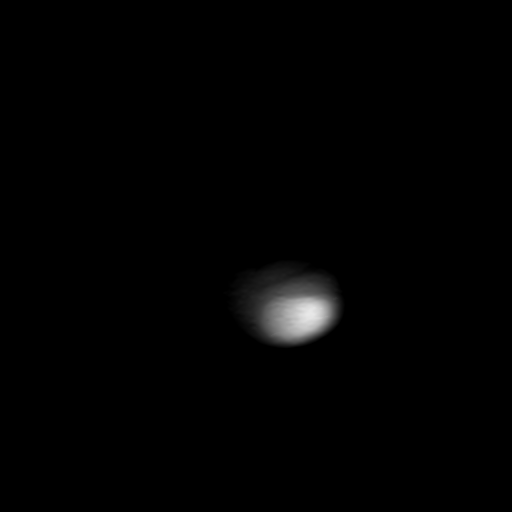
[im 11/33]
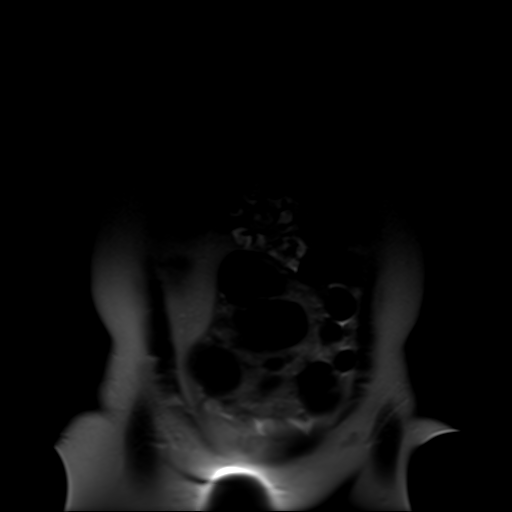
[im 22/33]
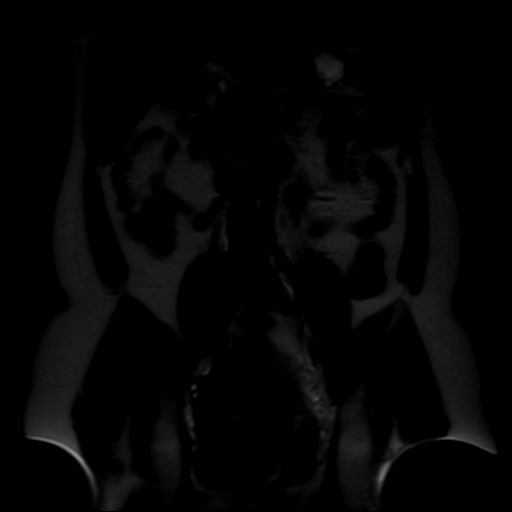
[im 33/33]
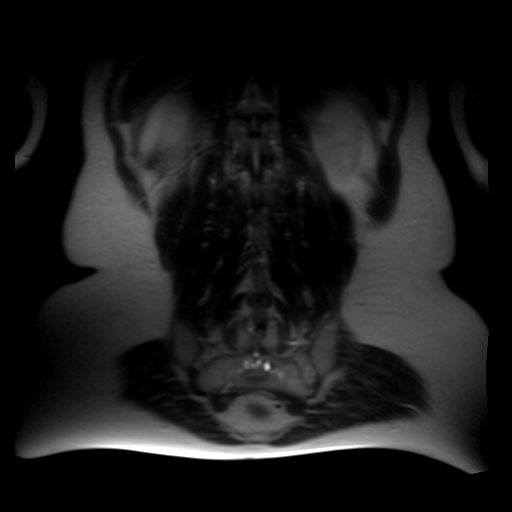

[Series 4: t2_tse_sag · sagittal · 5.0mm · 0.65mm/px · 4 of 33 slices shown]
[im 1/33]
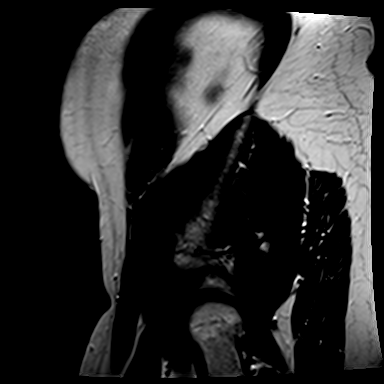
[im 11/33]
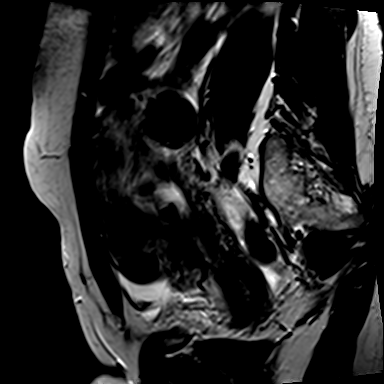
[im 22/33]
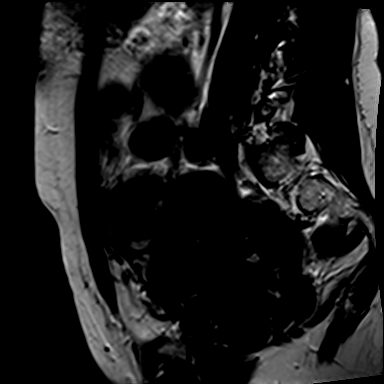
[im 33/33]
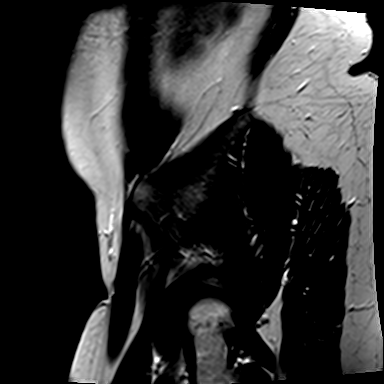

[Series 5: t2_tse axial · axial · 5.0mm · 0.51mm/px · z∈[-59,+172]mm · 4 of 38 slices shown]
[im 1/38]
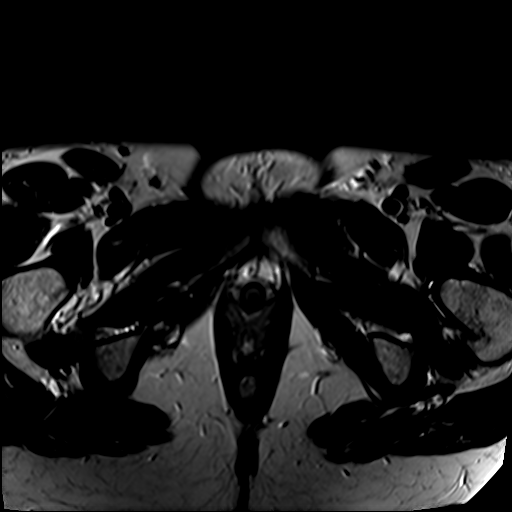
[im 13/38]
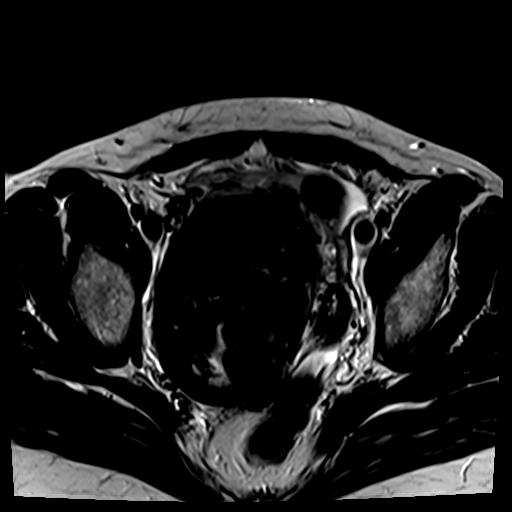
[im 25/38]
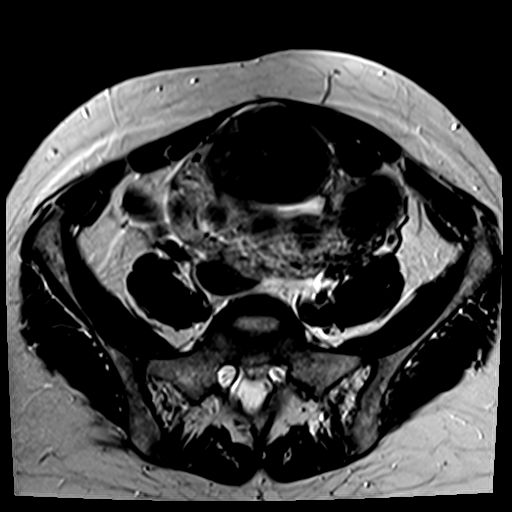
[im 38/38]
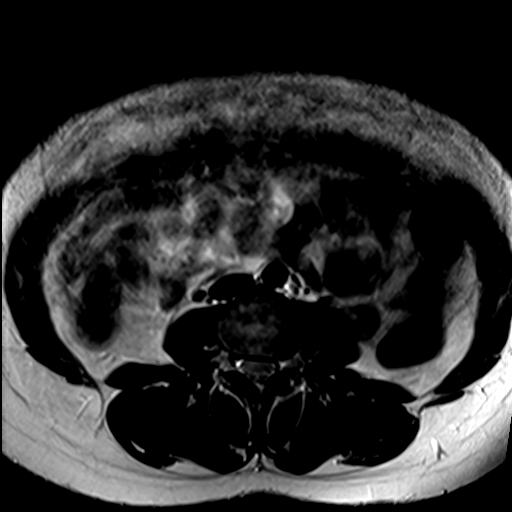

[Series 6: t2_tse axial fs · axial · 5.0mm · 0.51mm/px · z∈[-59,+172]mm · 4 of 38 slices shown]
[im 1/38]
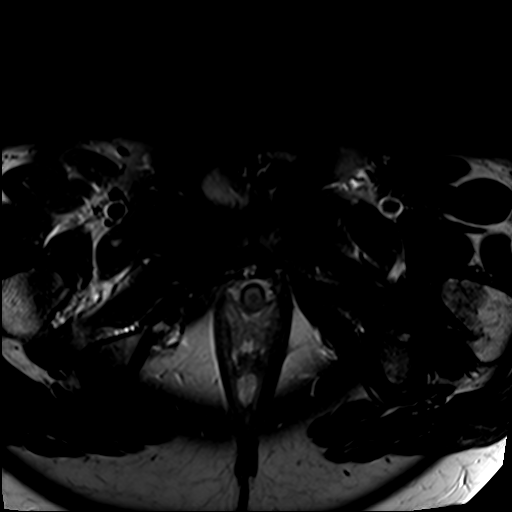
[im 13/38]
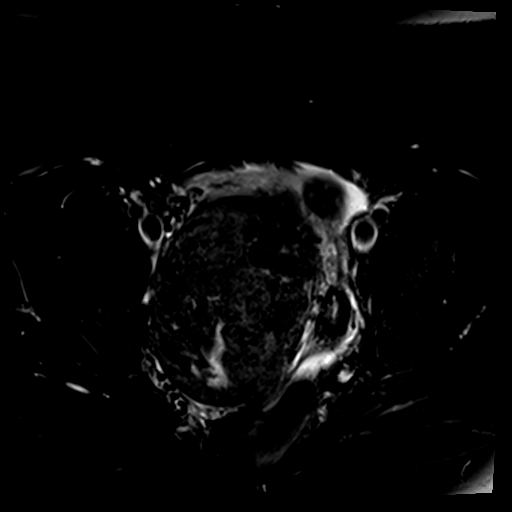
[im 25/38]
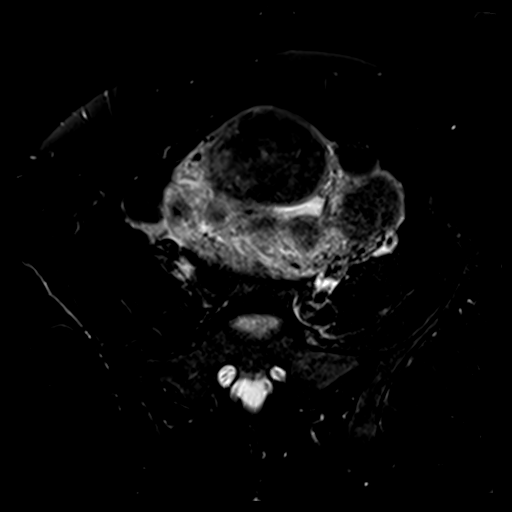
[im 38/38]
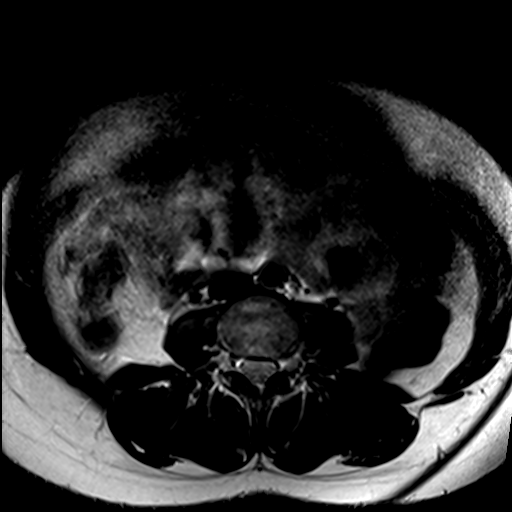

[Series 7: axial spgr · axial · 5.0mm · 0.51mm/px · z∈[-59,+172]mm · 4 of 38 slices shown]
[im 1/38]
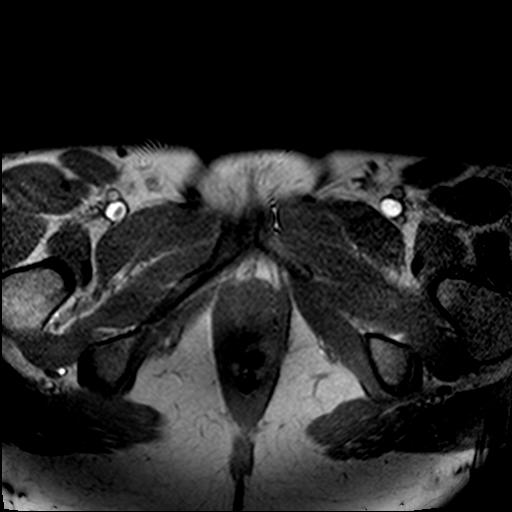
[im 13/38]
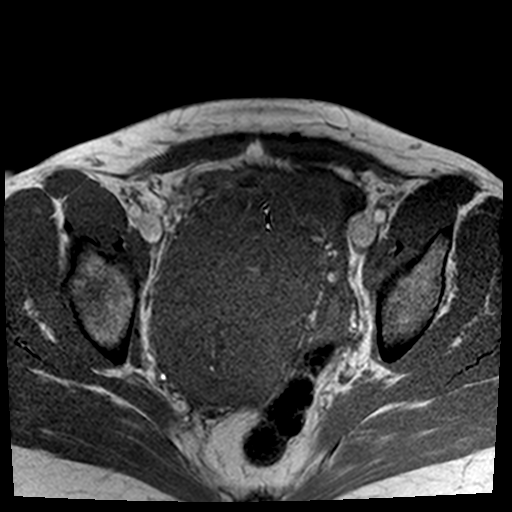
[im 25/38]
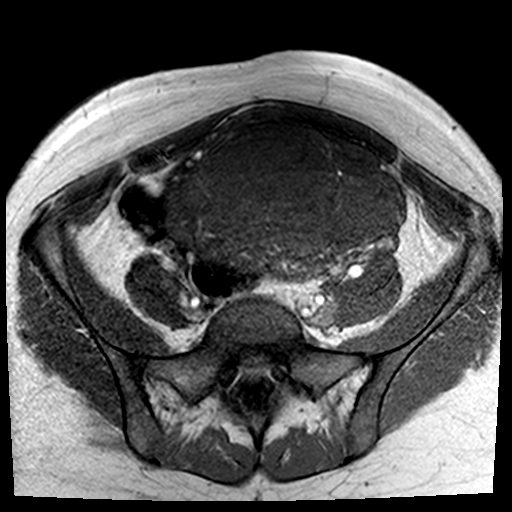
[im 38/38]
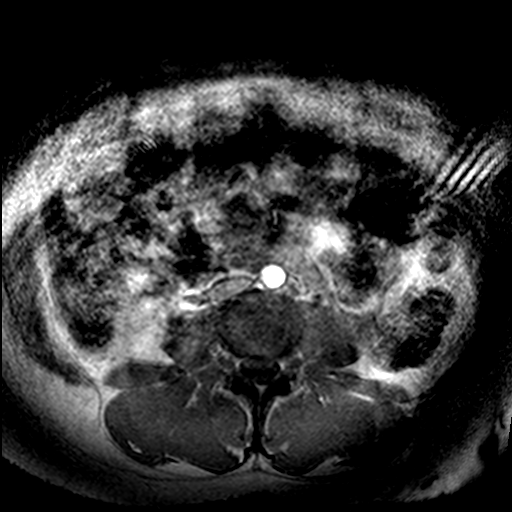

[20 of 48 positions shown; findings below may reference images not displayed]

FINDINGS: Urinary Tract: Large field-of-view coronal images demonstrate no
hydronephrosis. Both kidneys appear grossly normal. The bladder is
compressed by the enlarged, fibroid uterus, but demonstrates no
focal abnormality.

Bowel: No bowel wall thickening, distention or surrounding
inflammation identified within the pelvis.

Vascular/Lymphatic: No enlarged pelvic lymph nodes identified. No
significant vascular findings.

Reproductive:

Uterus: Measures 21.6 x 10.2 x 13.8 cm. There are multiple large
intramural fibroids. Fundal fibroid measures 6.5 x 5.4 x 6.5 cm.
There is a large slightly heterogeneous fibroid in the lower right
uterine segment which measures 13.0 x 8.3 x 11.2 cm. This extends
inferiorly into the pelvis, displacing the cervix and rectum to the
left. This fibroid demonstrates mildly heterogeneous enhancement
following contrast. The other fibroids enhance fairly homogeneously.
No pedunculated fibroids. There are several submucosal fibroids,
including a 1.8 cm 1 which protrudes into the endometrial canal
(best seen on sagittal image 21/14).

Endometrium: The endometrium is displaced to the left and distorted
by the fibroids.

Cervix/Vagina:  Appears normal.

Right ovary:  Appears normal.  No adnexal mass.

Left ovary:  Appears normal.  No adnexal mass.

Other: Small amount of free pelvic fluid both anteriorly and in the
cul-de-sac. No focal extraluminal fluid collection.

Musculoskeletal: No acute or worrisome osseous findings.
IMPRESSION: 1. Massive enlargement of the uterus by multiple fibroids, most of
which are intramural in location. There is a large fibroid in the
right lower uterine segment with inferior pelvic extension,
resulting in displacement of the cervix and rectum to the left. Some
of the smaller fibroids protrude into the endometrial canal which is
displaced to the left.
2. Most of the fibroids demonstrate fairly homogeneous enhancement,
and no aggressive lesions are suggested. Please note, there are no
reliable imaging findings to discriminate a benign uterine fibroid
from a malignant leiomyosarcoma. Imaging follow-up and/or surgical
management be based on clinical assessment.
3. No adnexal mass or hydronephrosis. Free pelvic fluid, within
physiologic limits.

## 2019-09-14 DIAGNOSIS — H11131 Conjunctival pigmentations, right eye: Secondary | ICD-10-CM | POA: Diagnosis not present

## 2019-09-14 DIAGNOSIS — H5213 Myopia, bilateral: Secondary | ICD-10-CM | POA: Diagnosis not present

## 2019-12-04 DIAGNOSIS — Z01419 Encounter for gynecological examination (general) (routine) without abnormal findings: Secondary | ICD-10-CM | POA: Diagnosis not present

## 2019-12-04 DIAGNOSIS — Z1231 Encounter for screening mammogram for malignant neoplasm of breast: Secondary | ICD-10-CM | POA: Diagnosis not present

## 2020-05-07 ENCOUNTER — Other Ambulatory Visit: Payer: Self-pay

## 2020-05-07 MED ORDER — METRONIDAZOLE 500 MG PO TABS
ORAL_TABLET | ORAL | 0 refills | Status: DC
  Filled 2020-05-07: qty 14, 7d supply, fill #0

## 2020-06-18 ENCOUNTER — Other Ambulatory Visit: Payer: Self-pay

## 2020-06-18 MED ORDER — METRONIDAZOLE 500 MG PO TABS
ORAL_TABLET | ORAL | 0 refills | Status: DC
  Filled 2020-06-18: qty 14, 7d supply, fill #0

## 2020-12-17 ENCOUNTER — Other Ambulatory Visit: Payer: Self-pay

## 2020-12-17 MED ORDER — MELOXICAM 15 MG PO TABS
ORAL_TABLET | ORAL | 0 refills | Status: DC
  Filled 2020-12-17: qty 30, 30d supply, fill #0

## 2020-12-17 MED ORDER — CYCLOBENZAPRINE HCL 10 MG PO TABS
ORAL_TABLET | ORAL | 0 refills | Status: AC
  Filled 2020-12-17: qty 20, 10d supply, fill #0

## 2021-02-26 ENCOUNTER — Ambulatory Visit
Admission: RE | Admit: 2021-02-26 | Discharge: 2021-02-26 | Disposition: A | Discharge status: Home / Self Care | Payer: No Typology Code available for payment source | Source: Ambulatory Visit | Attending: Emergency Medicine | Admitting: Emergency Medicine

## 2021-02-26 ENCOUNTER — Other Ambulatory Visit (HOSPITAL_COMMUNITY): Payer: Self-pay

## 2021-02-26 ENCOUNTER — Other Ambulatory Visit: Payer: Self-pay

## 2021-02-26 VITALS — BP 143/85 | HR 107 | Temp 100.3°F | Resp 18

## 2021-02-26 DIAGNOSIS — J020 Streptococcal pharyngitis: Secondary | ICD-10-CM | POA: Diagnosis not present

## 2021-02-26 LAB — POCT RAPID STREP A (OFFICE): Rapid Strep A Screen: POSITIVE — AB

## 2021-02-26 MED ORDER — PENICILLIN V POTASSIUM 500 MG PO TABS
500.0000 mg | ORAL_TABLET | Freq: Three times a day (TID) | ORAL | 0 refills | Status: AC
  Filled 2021-02-26: qty 30, 10d supply, fill #0

## 2021-02-26 NOTE — ED Triage Notes (Signed)
Patient presents to [facility] for evaluation of 24 hours of sore throat with some mild headache, otherwise no other symptoms.  No fever.  Was around her niece this past weekend, who also had a sore throat, and was diagnosed with a sinus infection.

## 2021-02-26 NOTE — ED Provider Notes (Signed)
[facility]    CSN: [id number] Arrival date & time: 02/26/21  1215    HISTORY   Chief Complaint  Patient presents with   Sore Throat   HPI Sara Zuniga is a 46 y.o. female. Patient presents to [facility] for evaluation of 24 hours of sore throat with some mild headache, otherwise no other symptoms.  No fever.  Was around her niece this past weekend, who also had a sore throat, but states when her sister took her to her pediatrician, they diagnosed her with a sinus infection.  Patient denies any other known sick contacts.  Patient states has not tried any over-the-counter medications or home remedies as of yet.  Patient denies history of frequent streptococcal pharyngitis infections.  Patient denies recent antibiotic use in the past 3 months.  The history is provided by the patient.  Past Medical History:  Diagnosis Date   Anemia    Fibroids    GERD (gastroesophageal reflux disease)    occ   Patient Active Problem List   Diagnosis Date Noted   Postoperative state 11/10/2017   Iron deficiency anemia due to chronic blood loss 07/09/2016   Uterine fibroid 03/23/2016   Grief 03/23/2016   Insomnia 03/23/2016   Menorrhagia with irregular cycle 03/23/2016   Past Surgical History:  Procedure Laterality Date   [place] AND SALPINGECTOMY N/A 11/10/2017   Procedure: XI ROBOTIC ASSISTED LAPAROSCOPIC HYSTERECTOMY AND BILATERAL  SALPINGECTOMY, MYOMECTOMY, CYSTOSCOPY;  Surgeon: Bobbye Charleston, MD;  Location: [facility];  Service: Gynecology;  Laterality: N/A;  Dr. Philis Pique will start with the [place] tower and place the 5 opti-view (in the LUQ).  She will morcellate and either remove pieces thru the vagina or she may use the retrival bag.  Please have   [place]   OB History   No obstetric history on file.    Home Medications    Prior to Admission medications   Medication Sig Start Date End Date  Taking? Authorizing Provider  penicillin v potassium (VEETID) 500 MG tablet Take 1 tablet (500 mg total) by mouth 3 (three) times daily for 10 days. 02/26/21 03/08/21 Yes Lynden Oxford Scales, PA-C  cyclobenzaprine (FLEXERIL) 10 MG tablet Take 1 tablet (10 mg total) by mouth 2 (two) times daily as needed for Muscle spasms for up to 10 days 12/17/20     meloxicam (MOBIC) 15 MG tablet Take 1 tablet (15 mg total) by mouth once daily 12/17/20      Family History Family History  Problem Relation Age of Onset   COPD Mother    Hypertension Sister    Seizures Sister    Social History Social History   Tobacco Use   Smoking status: Never   Smokeless tobacco: Never  Vaping Use   Vaping Use: Never used  Substance Use Topics   Alcohol use: Yes    Comment: weekends   Drug use: No   Allergies   Patient has no known allergies.  Review of Systems Review of Systems Pertinent findings noted in history of present illness.   Physical Exam Triage Vital Signs ED Triage Vitals  Enc Vitals Group     BP 11/15/20 0827 (!) 147/82     Pulse Rate 11/15/20 0827 72     Resp 11/15/20 0827 18     Temp 11/15/20 0827 98.3 F (36.8 C)     Temp Source 11/15/20 0827 Oral     SpO2  11/15/20 0827 98 %     Weight --      Height --      Head Circumference --      Peak Flow --      Pain Score 11/15/20 0826 5     Pain Loc --      Pain Edu? --      Excl. in [place]? --   No data found.  Updated Vital Signs BP (!) 143/85 (BP Location: Right Arm)    Pulse (!) 107    Temp 100.3 F (37.9 C) (Oral)    Resp 18    LMP 10/12/2017 (Exact Date)    SpO2 96%   Physical Exam Constitutional:      General: She is not in acute distress.    Appearance: She is well-developed. She is ill-appearing. She is not toxic-appearing.  HENT:     Head: Normocephalic and atraumatic.     Salivary Glands: Right salivary gland is diffusely enlarged and tender. Left salivary gland is diffusely enlarged and tender.     Right Ear: Hearing  and external ear normal.     Left Ear: Hearing and external ear normal.     Ears:     Comments: Bilateral EACs with mild erythema, bilateral TMs are normal    Nose: No mucosal edema, congestion or rhinorrhea.     Right Turbinates: Not enlarged, swollen or pale.     Left Turbinates: Not enlarged or swollen.     Right Sinus: No maxillary sinus tenderness or frontal sinus tenderness.     Left Sinus: No maxillary sinus tenderness or frontal sinus tenderness.     Mouth/Throat:     Lips: Pink. No lesions.     Mouth: Mucous membranes are moist. No oral lesions or angioedema.     Dentition: No gingival swelling.     Tongue: No lesions.     Palate: No mass.     Pharynx: Uvula midline. Pharyngeal swelling, oropharyngeal exudate and posterior oropharyngeal erythema present. No uvula swelling.     Tonsils: Tonsillar exudate present. 2+ on the right. 2+ on the left.  Eyes:     Extraocular Movements: Extraocular movements intact.     Conjunctiva/sclera: Conjunctivae normal.     Pupils: Pupils are equal, round, and reactive to light.  Neck:     Thyroid: No thyroid mass, thyromegaly or thyroid tenderness.     Trachea: Tracheal tenderness present. No abnormal tracheal secretions or tracheal deviation.     Comments: Voice is muffled Cardiovascular:     Rate and Rhythm: Normal rate and regular rhythm.     Pulses: Normal pulses.     Heart sounds: Normal heart sounds, S1 normal and S2 normal. No murmur heard.   No friction rub. No gallop.  Pulmonary:     Effort: Pulmonary effort is normal. No accessory muscle usage, prolonged expiration, respiratory distress or retractions.     Breath sounds: No stridor, decreased air movement or transmitted upper airway sounds. No decreased breath sounds, wheezing, rhonchi or rales.  Abdominal:     General: Bowel sounds are normal.     Palpations: Abdomen is soft.     Tenderness: There is generalized abdominal tenderness. There is no right CVA tenderness, left CVA  tenderness or rebound. Negative signs include Murphy's sign.     Hernia: No hernia is present.  Musculoskeletal:        General: No tenderness. Normal range of motion.     Cervical back: Full passive range  of motion without pain, normal range of motion and neck supple.     Right lower leg: No edema.     Left lower leg: No edema.  Lymphadenopathy:     Cervical: Cervical adenopathy present.     Right cervical: Superficial cervical adenopathy present.     Left cervical: Superficial cervical adenopathy present.  Skin:    General: Skin is warm and dry.     Findings: No erythema, lesion or rash.  Neurological:     General: No focal deficit present.     Mental Status: She is alert and oriented to person, place, and time. Mental status is at baseline.  Psychiatric:        Mood and Affect: Mood normal.        Behavior: Behavior normal.        Thought Content: Thought content normal.        Judgment: Judgment normal.    Visual Acuity Right Eye Distance:   Left Eye Distance:   Bilateral Distance:    Right Eye Near:   Left Eye Near:    Bilateral Near:     UC Couse / Diagnostics / Procedures:    EKG  Radiology No results found.  Procedures Procedures (including critical care time)  UC Diagnoses / Final Clinical Impressions(s)   I have reviewed the triage vital signs and the nursing notes.  Pertinent labs & imaging results that were available during my care of the patient were reviewed by me and considered in my medical decision making (see chart for details).   Final diagnoses:  Streptococcal pharyngitis   Patient provided with a prescription for penicillin, return precautions advised.  ED Prescriptions     Medication Sig Dispense Auth. Provider   penicillin v potassium (VEETID) 500 MG tablet Take 1 tablet (500 mg total) by mouth 3 (three) times daily for 10 days. 30 tablet Lynden Oxford Scales, PA-C      PDMP not reviewed this encounter.  Pending results:  Labs  Reviewed  POCT RAPID STREP A (OFFICE) - Abnormal; Notable for the following components:      Result Value   Rapid Strep A Screen Positive (*)    All other components within normal limits    Medications Ordered in UC: Medications - No data to display  Disposition Upon Discharge:  Condition: stable for discharge home Home: take medications as prescribed; routine discharge instructions as discussed; follow up as advised.  Patient presented with an acute illness with associated systemic symptoms and significant discomfort requiring urgent management. In my opinion, this is a condition that a prudent lay person (someone who possesses an average knowledge of health and medicine) may potentially expect to result in complications if not addressed urgently such as respiratory distress, impairment of bodily function or dysfunction of bodily organs.   Routine symptom specific, illness specific and/or disease specific instructions were discussed with the patient and/or caregiver at length.   As such, the patient has been evaluated and assessed, work-up was performed and treatment was provided in alignment with urgent care protocols and evidence based medicine.  Patient/parent/caregiver has been advised that the patient may require follow up for further testing and treatment if the symptoms continue in spite of treatment, as clinically indicated and appropriate.  If the patient was tested for COVID-19, Influenza and/or RSV, then the patient/parent/guardian was advised to isolate at home pending the results of his/her diagnostic coronavirus test and potentially longer if theyre positive. I have also advised pt that  if his/her COVID-19 test returns positive, it's recommended to self-isolate for at least 10 days after symptoms first appeared AND until fever-free for 24 hours without fever reducer AND other symptoms have improved or resolved. Discussed self-isolation recommendations as well as instructions for  household member/close contacts as per the [facility] and [place] DHHS, and also gave patient the [place] packet with this information.  Patient/parent/caregiver has been advised to return to the [facility] or PCP in 3-5 days if no better; to PCP or the Emergency Department if new signs and symptoms develop, or if the current signs or symptoms continue to change or worsen for further workup, evaluation and treatment as clinically indicated and appropriate  The patient will follow up with their current PCP if and as advised. If the patient does not currently have a PCP we will assist them in obtaining one.   The patient may need specialty follow up if the symptoms continue, in spite of conservative treatment and management, for further workup, evaluation, consultation and treatment as clinically indicated and appropriate.  Patient/parent/caregiver verbalized understanding and agreement of plan as discussed.  All questions were addressed during visit.  Please see discharge instructions below for further details of plan.  Discharge Instructions:   Discharge Instructions      Your rapid strep test today is positive.  Please begin penicillin 500 mg 3 times daily for 10 days now.  A prescription has been sent to your pharmacy.  Please take all doses as prescribed.  After taking antibiotics for 24 hours, you are no longer considered contagious and should begin to feel much better.  Please follow-up with your primary care provider in the next week to ten days for repeat evaluation if you have not had complete resolution of your symptoms.   Please follow-up within the next 3 to 5 days either with your primary care provider or urgent care if your symptoms do not resolve.  If you do not have a primary care provider, we will assist you in finding one.     This office note has been dictated using Museum/gallery curator.  Unfortunately, and despite my best efforts, this method of dictation can sometimes lead to  occasional typographical or grammatical errors.  I apologize in advance if this occurs.     Lynden Oxford Scales, PA-C 02/26/21 1328

## 2021-02-26 NOTE — Discharge Instructions (Addendum)
Your rapid strep test today is positive.  Please begin penicillin 500 mg 3 times daily for 10 days now.  A prescription has been sent to your pharmacy.  Please take all doses as prescribed.  After taking antibiotics for 24 hours, you are no longer considered contagious and should begin to feel much better.  Please follow-up with your primary care provider in the next week to ten days for repeat evaluation if you have not had complete resolution of your symptoms.   Please follow-up within the next 3 to 5 days either with your primary care provider or urgent care if your symptoms do not resolve.  If you do not have a primary care provider, we will assist you in finding one.

## 2021-03-06 ENCOUNTER — Telehealth (HOSPITAL_COMMUNITY): Payer: Self-pay | Admitting: Emergency Medicine

## 2021-03-06 ENCOUNTER — Other Ambulatory Visit: Payer: Self-pay

## 2021-03-06 MED ORDER — FLUCONAZOLE 150 MG PO TABS
150.0000 mg | ORAL_TABLET | Freq: Once | ORAL | 0 refills | Status: AC
  Filled 2021-03-06: qty 2, 2d supply, fill #0

## 2021-03-06 NOTE — Telephone Encounter (Signed)
Sending Diflucan for antibiotic associated yeast, per protocol

## 2021-05-02 ENCOUNTER — Other Ambulatory Visit: Payer: Self-pay

## 2021-05-02 MED ORDER — METRONIDAZOLE 500 MG PO TABS
ORAL_TABLET | ORAL | 3 refills | Status: AC
  Filled 2021-05-02: qty 14, 7d supply, fill #0

## 2021-08-27 ENCOUNTER — Other Ambulatory Visit: Payer: Self-pay | Admitting: Internal Medicine

## 2021-08-27 DIAGNOSIS — Z1231 Encounter for screening mammogram for malignant neoplasm of breast: Secondary | ICD-10-CM

## 2021-09-26 ENCOUNTER — Ambulatory Visit
Admission: RE | Admit: 2021-09-26 | Discharge: 2021-09-26 | Disposition: A | Discharge status: Home / Self Care | Payer: No Typology Code available for payment source | Source: Ambulatory Visit | Attending: Internal Medicine | Admitting: Internal Medicine

## 2021-09-26 DIAGNOSIS — Z1231 Encounter for screening mammogram for malignant neoplasm of breast: Secondary | ICD-10-CM | POA: Diagnosis present

## 2021-10-28 ENCOUNTER — Other Ambulatory Visit: Payer: Self-pay

## 2021-10-29 ENCOUNTER — Other Ambulatory Visit: Payer: Self-pay

## 2021-10-29 MED ORDER — MELOXICAM 15 MG PO TABS
ORAL_TABLET | ORAL | 0 refills | Status: DC
  Filled 2021-10-29: qty 30, 30d supply, fill #0

## 2022-07-30 ENCOUNTER — Other Ambulatory Visit: Payer: Self-pay | Admitting: Internal Medicine

## 2022-07-30 DIAGNOSIS — Z1231 Encounter for screening mammogram for malignant neoplasm of breast: Secondary | ICD-10-CM

## 2022-08-12 DIAGNOSIS — Z113 Encounter for screening for infections with a predominantly sexual mode of transmission: Secondary | ICD-10-CM | POA: Diagnosis not present

## 2022-08-12 DIAGNOSIS — R7303 Prediabetes: Secondary | ICD-10-CM | POA: Diagnosis not present

## 2022-08-12 DIAGNOSIS — M545 Low back pain, unspecified: Secondary | ICD-10-CM | POA: Diagnosis not present

## 2022-08-12 DIAGNOSIS — Z114 Encounter for screening for human immunodeficiency virus [HIV]: Secondary | ICD-10-CM | POA: Diagnosis not present

## 2022-08-12 DIAGNOSIS — G8929 Other chronic pain: Secondary | ICD-10-CM | POA: Diagnosis not present

## 2022-08-12 DIAGNOSIS — E782 Mixed hyperlipidemia: Secondary | ICD-10-CM | POA: Diagnosis not present

## 2022-08-12 DIAGNOSIS — N898 Other specified noninflammatory disorders of vagina: Secondary | ICD-10-CM | POA: Diagnosis not present

## 2022-11-23 DIAGNOSIS — M545 Low back pain, unspecified: Secondary | ICD-10-CM | POA: Diagnosis not present

## 2022-11-23 DIAGNOSIS — Z1231 Encounter for screening mammogram for malignant neoplasm of breast: Secondary | ICD-10-CM | POA: Diagnosis not present

## 2022-11-23 DIAGNOSIS — Z1211 Encounter for screening for malignant neoplasm of colon: Secondary | ICD-10-CM | POA: Diagnosis not present

## 2022-11-23 DIAGNOSIS — E782 Mixed hyperlipidemia: Secondary | ICD-10-CM | POA: Diagnosis not present

## 2022-11-23 DIAGNOSIS — R7303 Prediabetes: Secondary | ICD-10-CM | POA: Diagnosis not present

## 2022-11-23 DIAGNOSIS — Z0000 Encounter for general adult medical examination without abnormal findings: Secondary | ICD-10-CM | POA: Diagnosis not present

## 2022-11-23 DIAGNOSIS — G8929 Other chronic pain: Secondary | ICD-10-CM | POA: Diagnosis not present

## 2022-11-24 ENCOUNTER — Other Ambulatory Visit: Payer: Self-pay | Admitting: Internal Medicine

## 2022-11-24 DIAGNOSIS — Z1231 Encounter for screening mammogram for malignant neoplasm of breast: Secondary | ICD-10-CM

## 2022-11-26 DIAGNOSIS — Z0000 Encounter for general adult medical examination without abnormal findings: Secondary | ICD-10-CM | POA: Diagnosis not present

## 2022-11-26 DIAGNOSIS — E782 Mixed hyperlipidemia: Secondary | ICD-10-CM | POA: Diagnosis not present

## 2022-11-26 DIAGNOSIS — G8929 Other chronic pain: Secondary | ICD-10-CM | POA: Diagnosis not present

## 2022-11-26 DIAGNOSIS — M545 Low back pain, unspecified: Secondary | ICD-10-CM | POA: Diagnosis not present

## 2023-05-04 ENCOUNTER — Encounter

## 2023-06-23 ENCOUNTER — Encounter

## 2023-07-01 ENCOUNTER — Ambulatory Visit
Admission: RE | Admit: 2023-07-01 | Discharge: 2023-07-01 | Disposition: A | Discharge status: Home / Self Care | Source: Ambulatory Visit | Attending: Internal Medicine | Admitting: Internal Medicine

## 2023-07-01 ENCOUNTER — Other Ambulatory Visit: Payer: Self-pay

## 2023-07-01 DIAGNOSIS — Z1231 Encounter for screening mammogram for malignant neoplasm of breast: Secondary | ICD-10-CM | POA: Diagnosis not present

## 2023-07-01 MED ORDER — MELOXICAM 15 MG PO TABS
15.0000 mg | ORAL_TABLET | Freq: Every day | ORAL | 0 refills | Status: AC | PRN
  Filled 2023-07-01: qty 20, 20d supply, fill #0

## 2023-07-05 ENCOUNTER — Other Ambulatory Visit: Payer: Self-pay

## 2023-07-05 MED ORDER — CYCLOBENZAPRINE HCL 10 MG PO TABS
10.0000 mg | ORAL_TABLET | Freq: Two times a day (BID) | ORAL | 0 refills | Status: AC | PRN
  Filled 2023-07-05: qty 20, 10d supply, fill #0

## 2023-09-24 DIAGNOSIS — E559 Vitamin D deficiency, unspecified: Secondary | ICD-10-CM | POA: Diagnosis not present

## 2023-09-24 DIAGNOSIS — E538 Deficiency of other specified B group vitamins: Secondary | ICD-10-CM | POA: Diagnosis not present

## 2023-09-24 DIAGNOSIS — R7303 Prediabetes: Secondary | ICD-10-CM | POA: Diagnosis not present

## 2023-09-24 DIAGNOSIS — N951 Menopausal and female climacteric states: Secondary | ICD-10-CM | POA: Diagnosis not present

## 2023-09-24 DIAGNOSIS — L659 Nonscarring hair loss, unspecified: Secondary | ICD-10-CM | POA: Diagnosis not present

## 2023-09-24 DIAGNOSIS — E782 Mixed hyperlipidemia: Secondary | ICD-10-CM | POA: Diagnosis not present

## 2023-09-29 DIAGNOSIS — Z832 Family history of diseases of the blood and blood-forming organs and certain disorders involving the immune mechanism: Secondary | ICD-10-CM | POA: Diagnosis not present

## 2023-10-15 ENCOUNTER — Other Ambulatory Visit: Payer: Self-pay

## 2023-10-18 ENCOUNTER — Other Ambulatory Visit: Payer: Self-pay

## 2023-10-18 MED ORDER — MELOXICAM 15 MG PO TABS
15.0000 mg | ORAL_TABLET | Freq: Every day | ORAL | 0 refills | Status: DC | PRN
  Filled 2023-10-18: qty 20, 20d supply, fill #0

## 2023-11-24 ENCOUNTER — Other Ambulatory Visit: Payer: Self-pay

## 2023-11-24 DIAGNOSIS — M5440 Lumbago with sciatica, unspecified side: Secondary | ICD-10-CM | POA: Diagnosis not present

## 2023-11-24 DIAGNOSIS — Z0000 Encounter for general adult medical examination without abnormal findings: Secondary | ICD-10-CM | POA: Diagnosis not present

## 2023-11-24 DIAGNOSIS — Z1331 Encounter for screening for depression: Secondary | ICD-10-CM | POA: Diagnosis not present

## 2023-11-24 DIAGNOSIS — Z1211 Encounter for screening for malignant neoplasm of colon: Secondary | ICD-10-CM | POA: Diagnosis not present

## 2023-11-24 MED ORDER — METHYLPREDNISOLONE 4 MG PO TBPK
ORAL_TABLET | ORAL | 0 refills | Status: AC
  Filled 2023-11-24: qty 21, 6d supply, fill #0

## 2023-11-24 MED ORDER — MELOXICAM 15 MG PO TABS
ORAL_TABLET | ORAL | 0 refills | Status: DC
  Filled 2023-11-24: qty 30, 30d supply, fill #0

## 2023-11-29 ENCOUNTER — Other Ambulatory Visit: Payer: Self-pay

## 2023-11-29 DIAGNOSIS — Z0000 Encounter for general adult medical examination without abnormal findings: Secondary | ICD-10-CM | POA: Diagnosis not present

## 2023-11-29 MED ORDER — ROSUVASTATIN CALCIUM 5 MG PO TABS
5.0000 mg | ORAL_TABLET | Freq: Every day | ORAL | 11 refills | Status: AC
  Filled 2023-11-29: qty 30, 30d supply, fill #0
  Filled 2023-12-25: qty 30, 30d supply, fill #1
  Filled 2024-01-25: qty 30, 30d supply, fill #2

## 2024-02-09 ENCOUNTER — Other Ambulatory Visit: Payer: Self-pay

## 2024-02-09 MED ORDER — NA SULFATE-K SULFATE-MG SULF 17.5-3.13-1.6 GM/177ML PO SOLN
ORAL | 0 refills | Status: AC
  Filled 2024-02-09: qty 354, 1d supply, fill #0

## 2024-03-01 ENCOUNTER — Ambulatory Visit: Admission: RE | Admit: 2024-03-01 | Source: Home / Self Care | Admitting: Gastroenterology

## 2024-03-01 ENCOUNTER — Encounter: Admission: RE | Payer: Self-pay | Source: Home / Self Care

## 2024-03-01 SURGERY — COLONOSCOPY
Anesthesia: General
